# Patient Record
Sex: Female | Born: 1950 | Race: White | Hispanic: No | Marital: Married | Smoking: Never smoker
Health system: Southern US, Community
[De-identification: ages and names within clinical notes are randomized; demographics above are authoritative.]

## PROBLEM LIST (undated history)

## (undated) DIAGNOSIS — J449 Chronic obstructive pulmonary disease, unspecified: Secondary | ICD-10-CM

## (undated) DIAGNOSIS — J45909 Unspecified asthma, uncomplicated: Secondary | ICD-10-CM

## (undated) DIAGNOSIS — R569 Unspecified convulsions: Secondary | ICD-10-CM

## (undated) DIAGNOSIS — I509 Heart failure, unspecified: Secondary | ICD-10-CM

## (undated) HISTORY — PX: HERNIA REPAIR: SHX51

## (undated) HISTORY — PX: GALLBLADDER SURGERY: SHX652

---

## 2005-09-28 ENCOUNTER — Emergency Department: Payer: Self-pay | Admitting: Emergency Medicine

## 2007-08-06 ENCOUNTER — Emergency Department: Payer: Self-pay | Admitting: Emergency Medicine

## 2007-08-07 ENCOUNTER — Emergency Department: Payer: Self-pay | Admitting: Emergency Medicine

## 2008-04-16 ENCOUNTER — Ambulatory Visit: Payer: Self-pay | Admitting: Pain Medicine

## 2008-12-13 ENCOUNTER — Inpatient Hospital Stay: Payer: Self-pay | Admitting: Internal Medicine

## 2009-01-08 ENCOUNTER — Emergency Department: Payer: Self-pay | Admitting: Emergency Medicine

## 2009-01-16 ENCOUNTER — Emergency Department: Payer: Self-pay | Admitting: Emergency Medicine

## 2009-03-09 ENCOUNTER — Emergency Department: Payer: Self-pay | Admitting: Emergency Medicine

## 2009-03-12 ENCOUNTER — Emergency Department: Payer: Self-pay | Admitting: Emergency Medicine

## 2009-04-27 ENCOUNTER — Emergency Department: Payer: Self-pay | Admitting: Emergency Medicine

## 2009-06-15 ENCOUNTER — Emergency Department: Payer: Self-pay | Admitting: Emergency Medicine

## 2009-07-13 ENCOUNTER — Inpatient Hospital Stay: Payer: Self-pay | Admitting: Internal Medicine

## 2009-09-30 ENCOUNTER — Inpatient Hospital Stay: Payer: Self-pay | Admitting: Internal Medicine

## 2009-10-11 ENCOUNTER — Inpatient Hospital Stay: Payer: Self-pay | Admitting: Internal Medicine

## 2010-04-13 ENCOUNTER — Emergency Department: Payer: Self-pay | Admitting: Emergency Medicine

## 2010-05-24 ENCOUNTER — Emergency Department: Payer: Self-pay | Admitting: Emergency Medicine

## 2010-10-27 IMAGING — CT CT CERVICAL SPINE WITHOUT CONTRAST
2 series · 16 of 27 positions shown, 20 images · non-contrast
Comparison: none

REASON FOR EXAM: neck pain
COMMENTS:

PROCEDURE:     CT  - CT CERVICAL SPINE WO  - May 24, 2010  [DATE]
RESULT:     CT cervical spine dated 05/24/2010 comparison made to prior
study dated 10/11/2009.
TECHNIQUE: Multiplanar imaging cervical spine was obtained utilizing helical
2 mm acquisition and bone reconstruction algorithm.

[Series 5: sagittal · sagittal · 0.42mm/px · 5 of 55 slices shown, 6 images]
[im 19/55  bone]
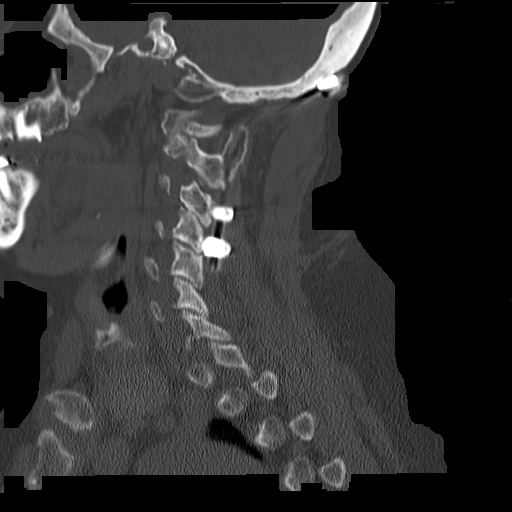
[im 23/55  bone]
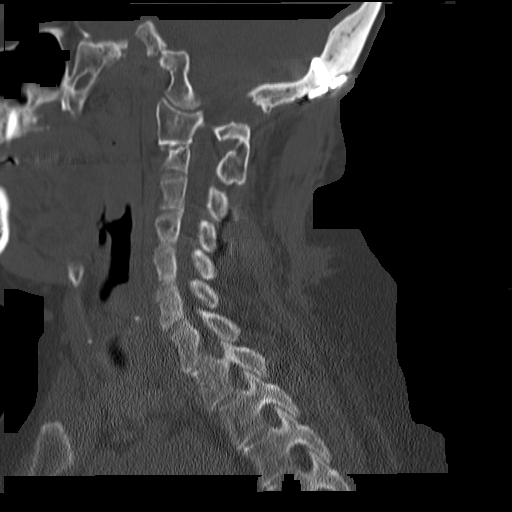
[im 28/55  soft-tissue]
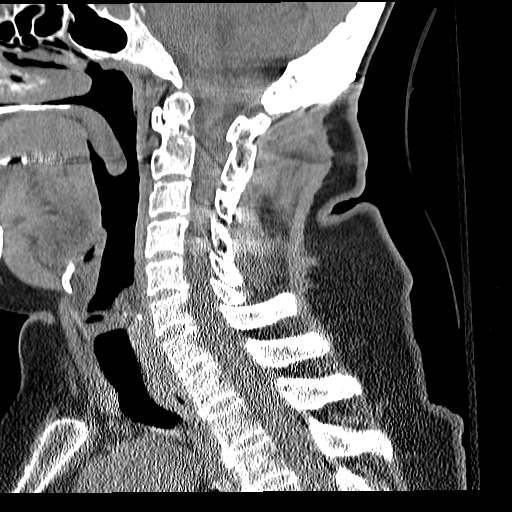
[im 28/55  bone]
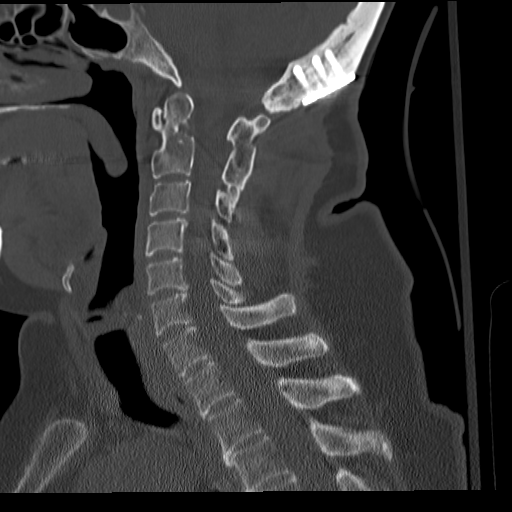
[im 32/55  bone]
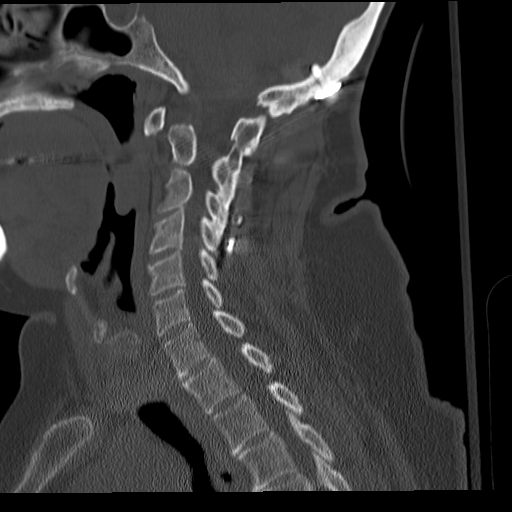
[im 37/55  bone]
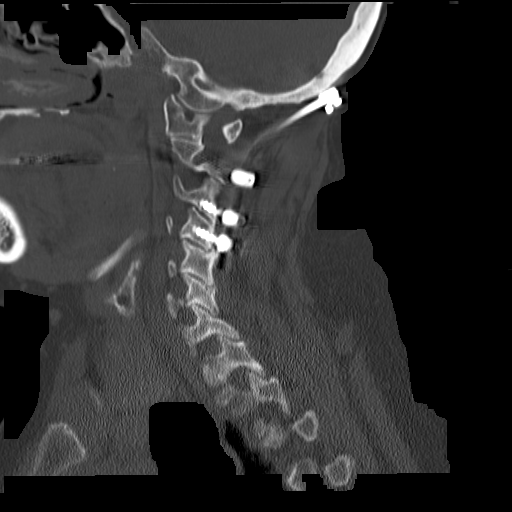

[Series 6: axial · axial · 0.34mm/px · z∈[-266,-110]mm · 11 of 94 slices shown, 14 images]
[im 8/94  soft-tissue]
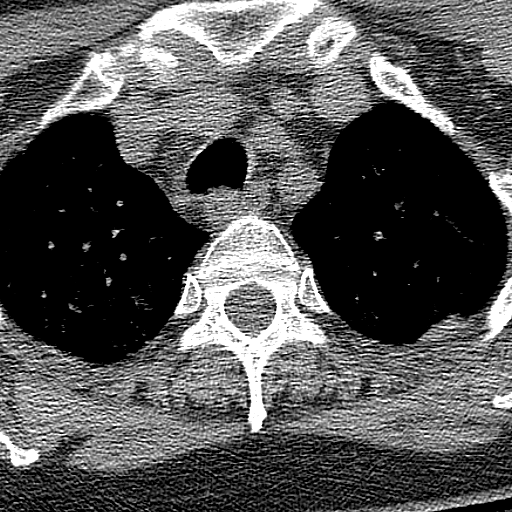
[im 8/94  bone]
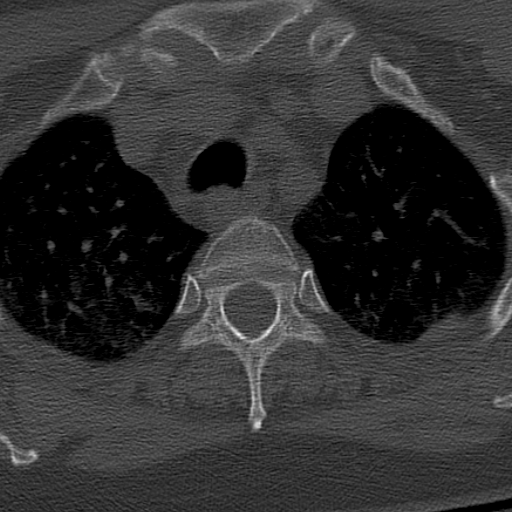
[im 15/94  bone]
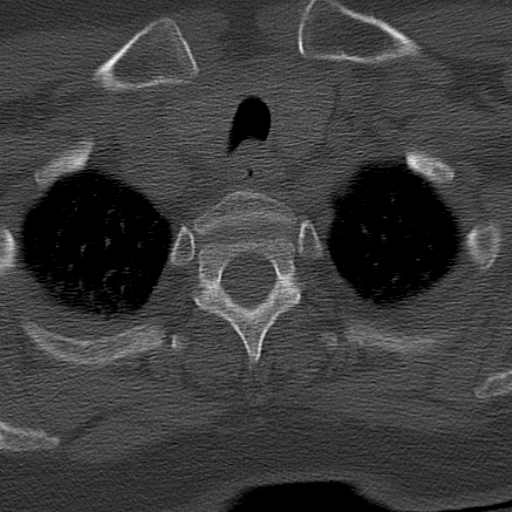
[im 22/94  bone]
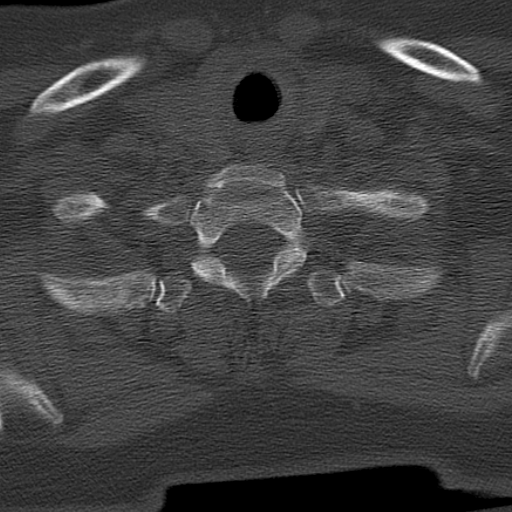
[im 29/94  bone]
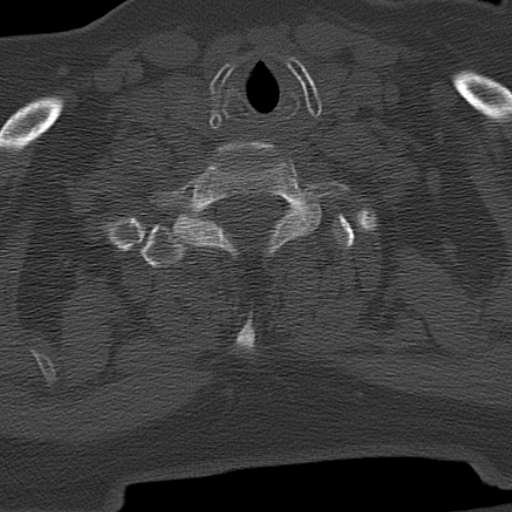
[im 36/94  soft-tissue]
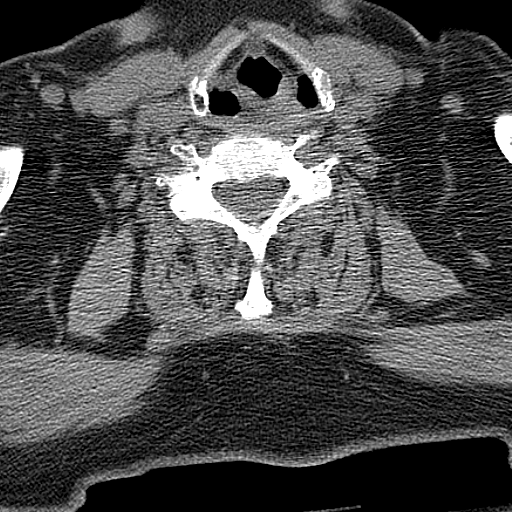
[im 36/94  bone]
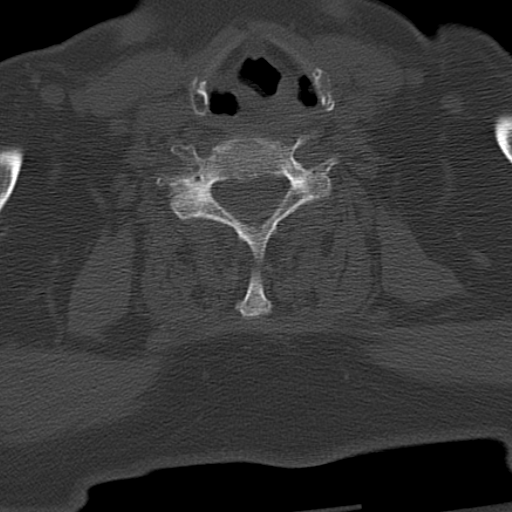
[im 51/94  bone]
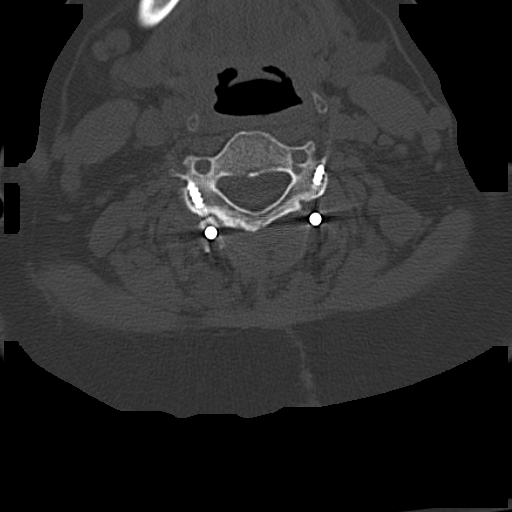
[im 58/94  bone]
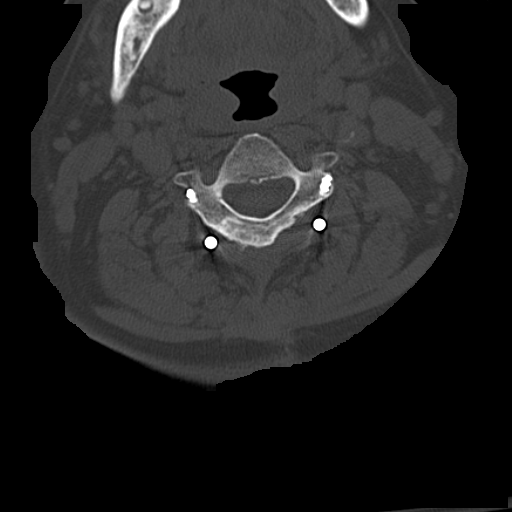
[im 65/94  bone]
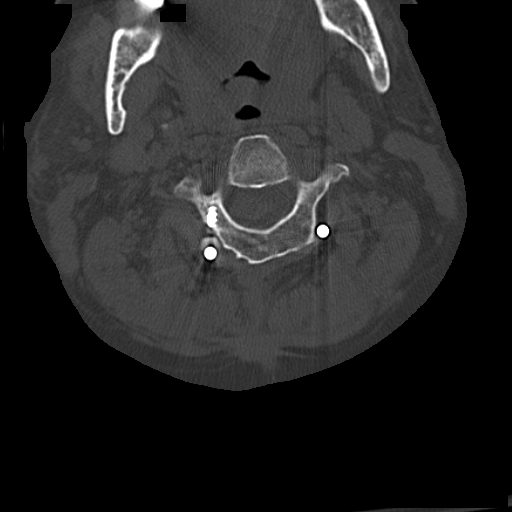
[im 72/94  soft-tissue]
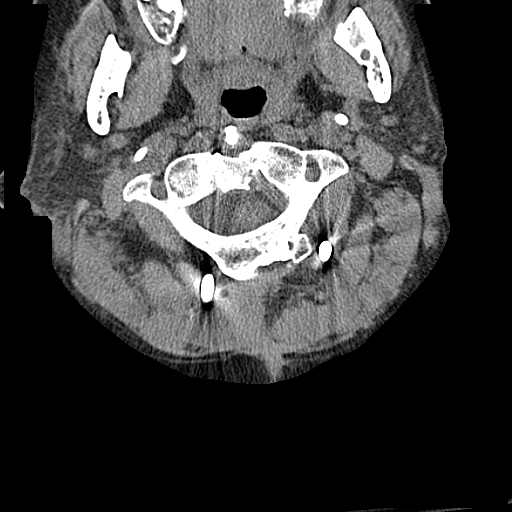
[im 72/94  bone]
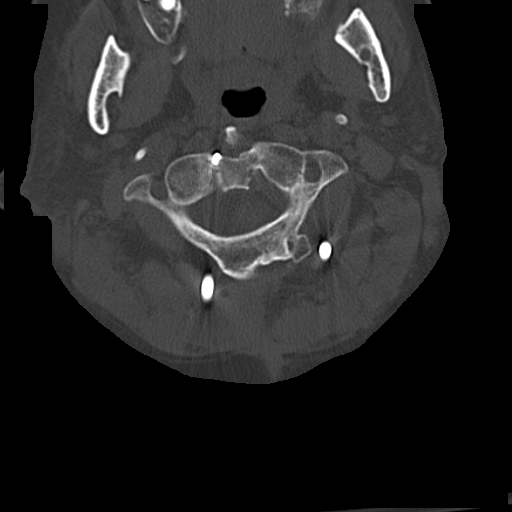
[im 79/94  bone]
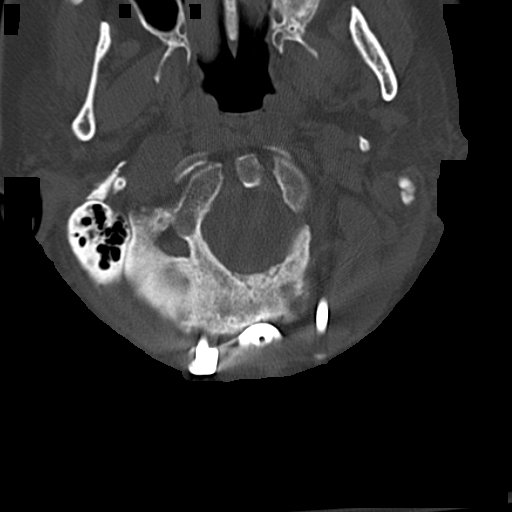
[im 86/94  bone]
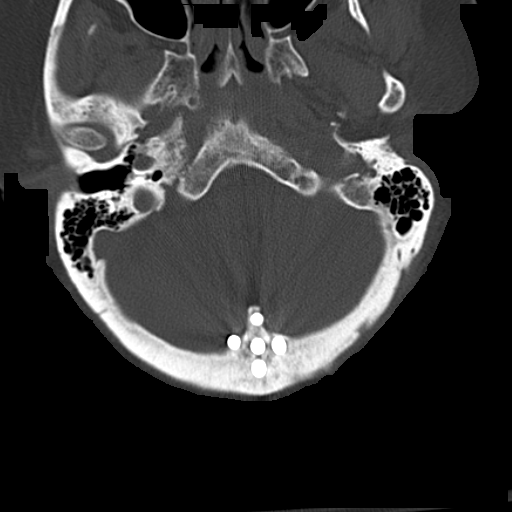

[16 of 27 positions shown; findings below may reference images not displayed]

FINDINGS: Postsurgical changes are once again identified within posterior
aspects of the cervical spine. Hardware appears intact. There is no evidence
of acute fracture the dislocation. The cervical canal is patent. There is no
evidence of prevertebral soft tissue swelling.
IMPRESSION: No CT evidence of acute osseous abnormalities.

## 2010-10-27 IMAGING — CR DG ABDOMEN 2V
1 series · 3 of 3 positions shown · non-contrast
Comparison: none

REASON FOR EXAM: pain
COMMENTS:

PROCEDURE:     DXR - DXR ABDOMEN 2 V FLAT AND ERECT  - May 24, 2010 [DATE]
RESULT:
Air is seen within nondilated loops of large and small bowel. A large amount
of stool is appreciated within the colon. The visualized bony skeleton
demonstrates S-shaped scoliosis within the thoracolumbar spine.

[Series 1: view not recorded · 0.17mm/px · 3 of 3 slices shown]
[im 1/3]
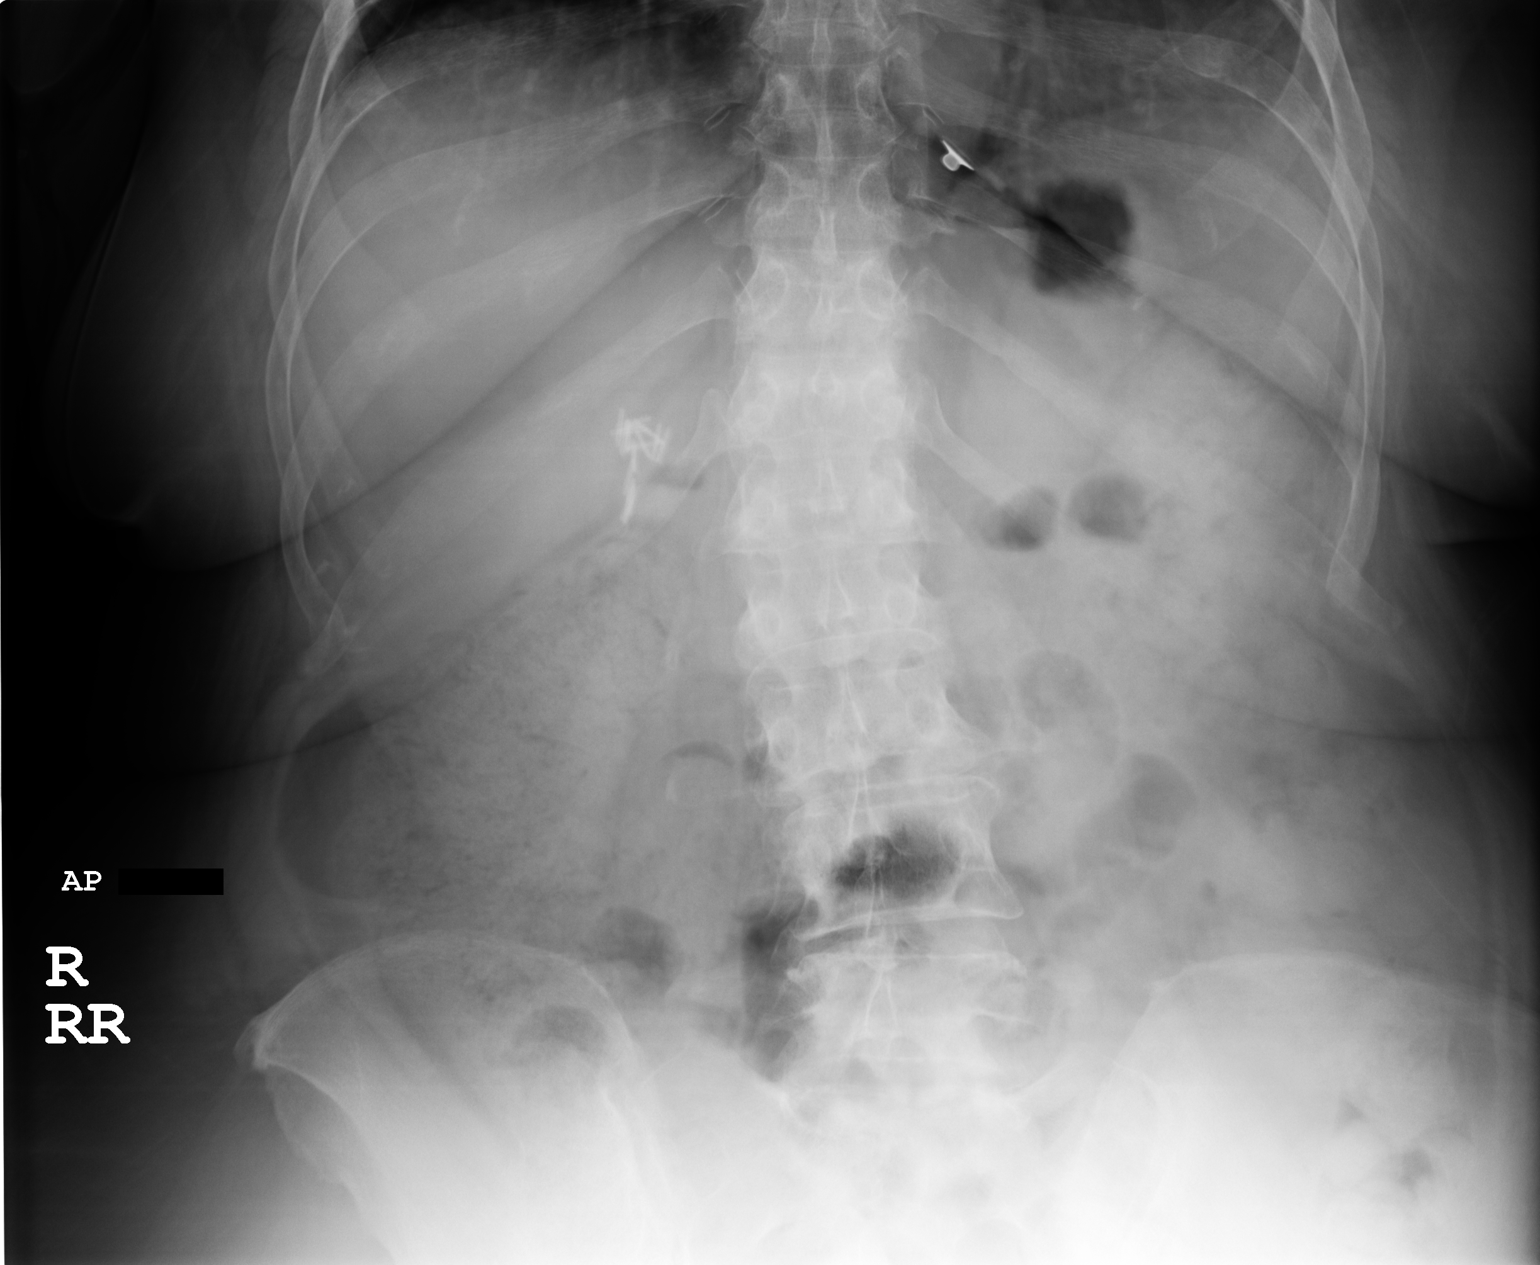
[im 2/3]
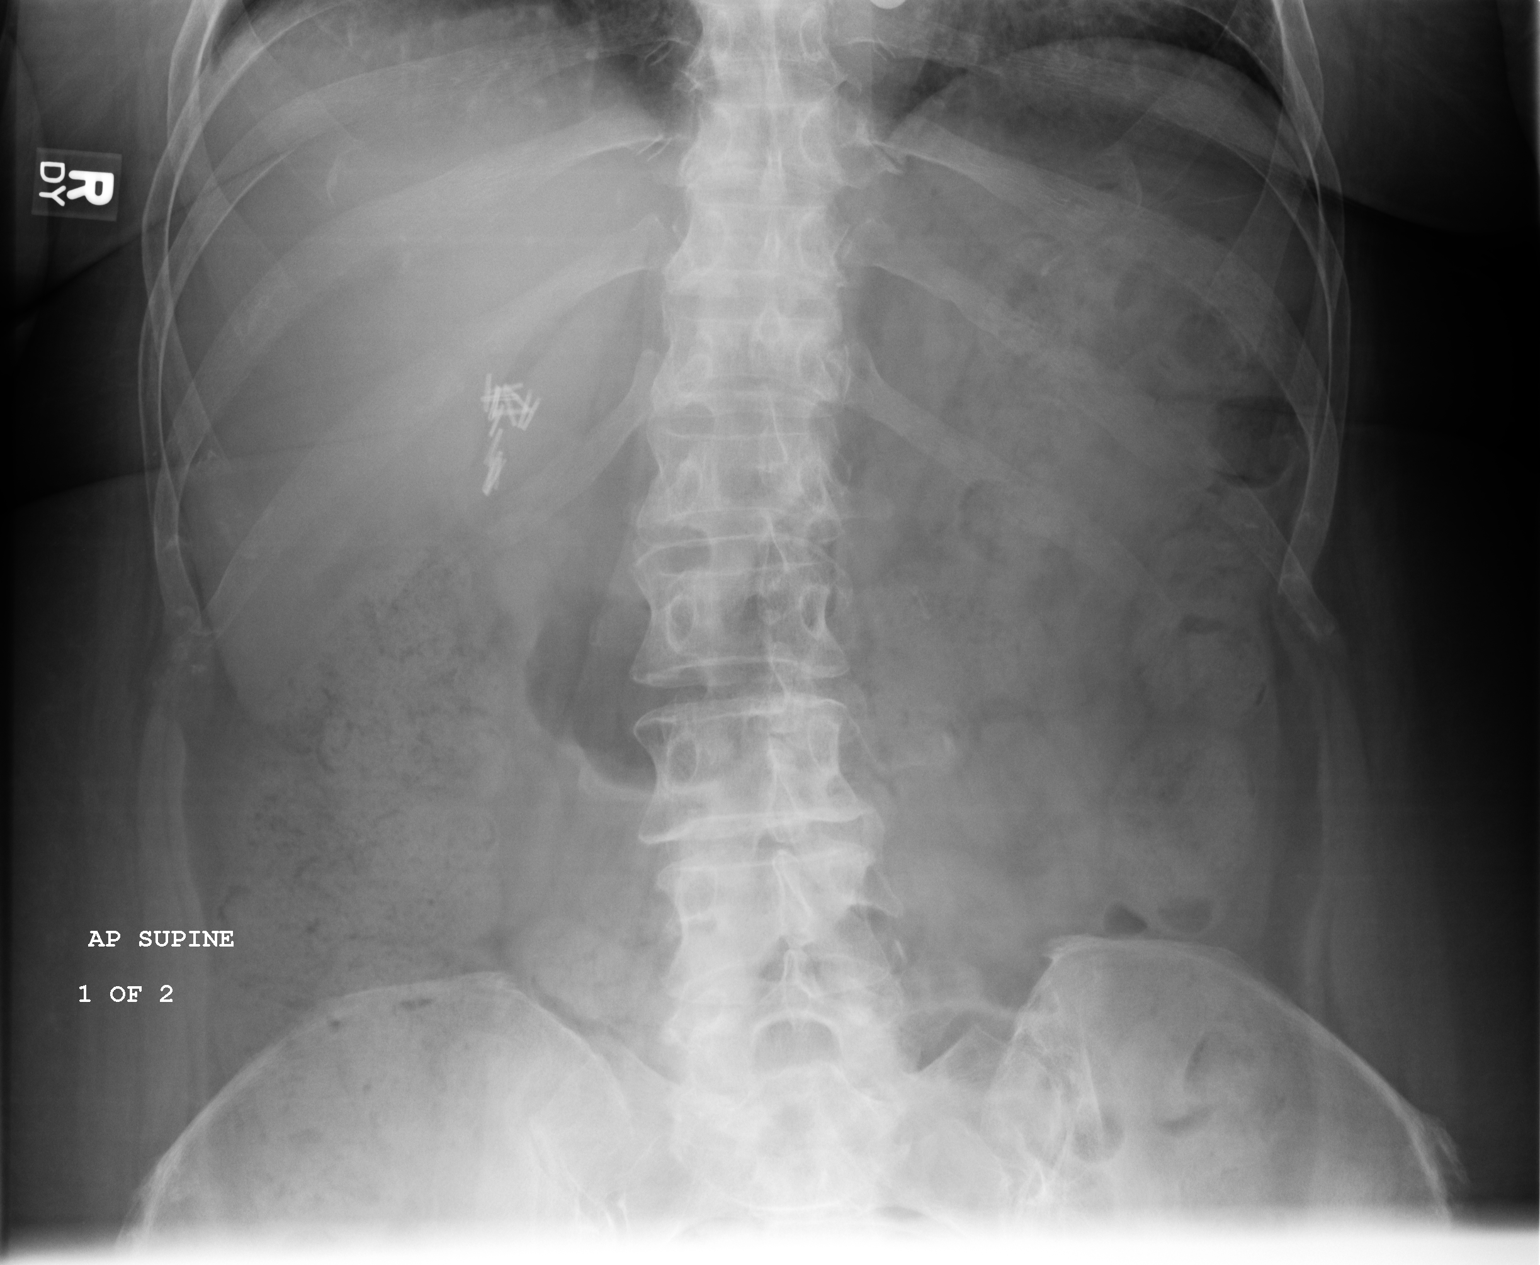
[im 3/3]
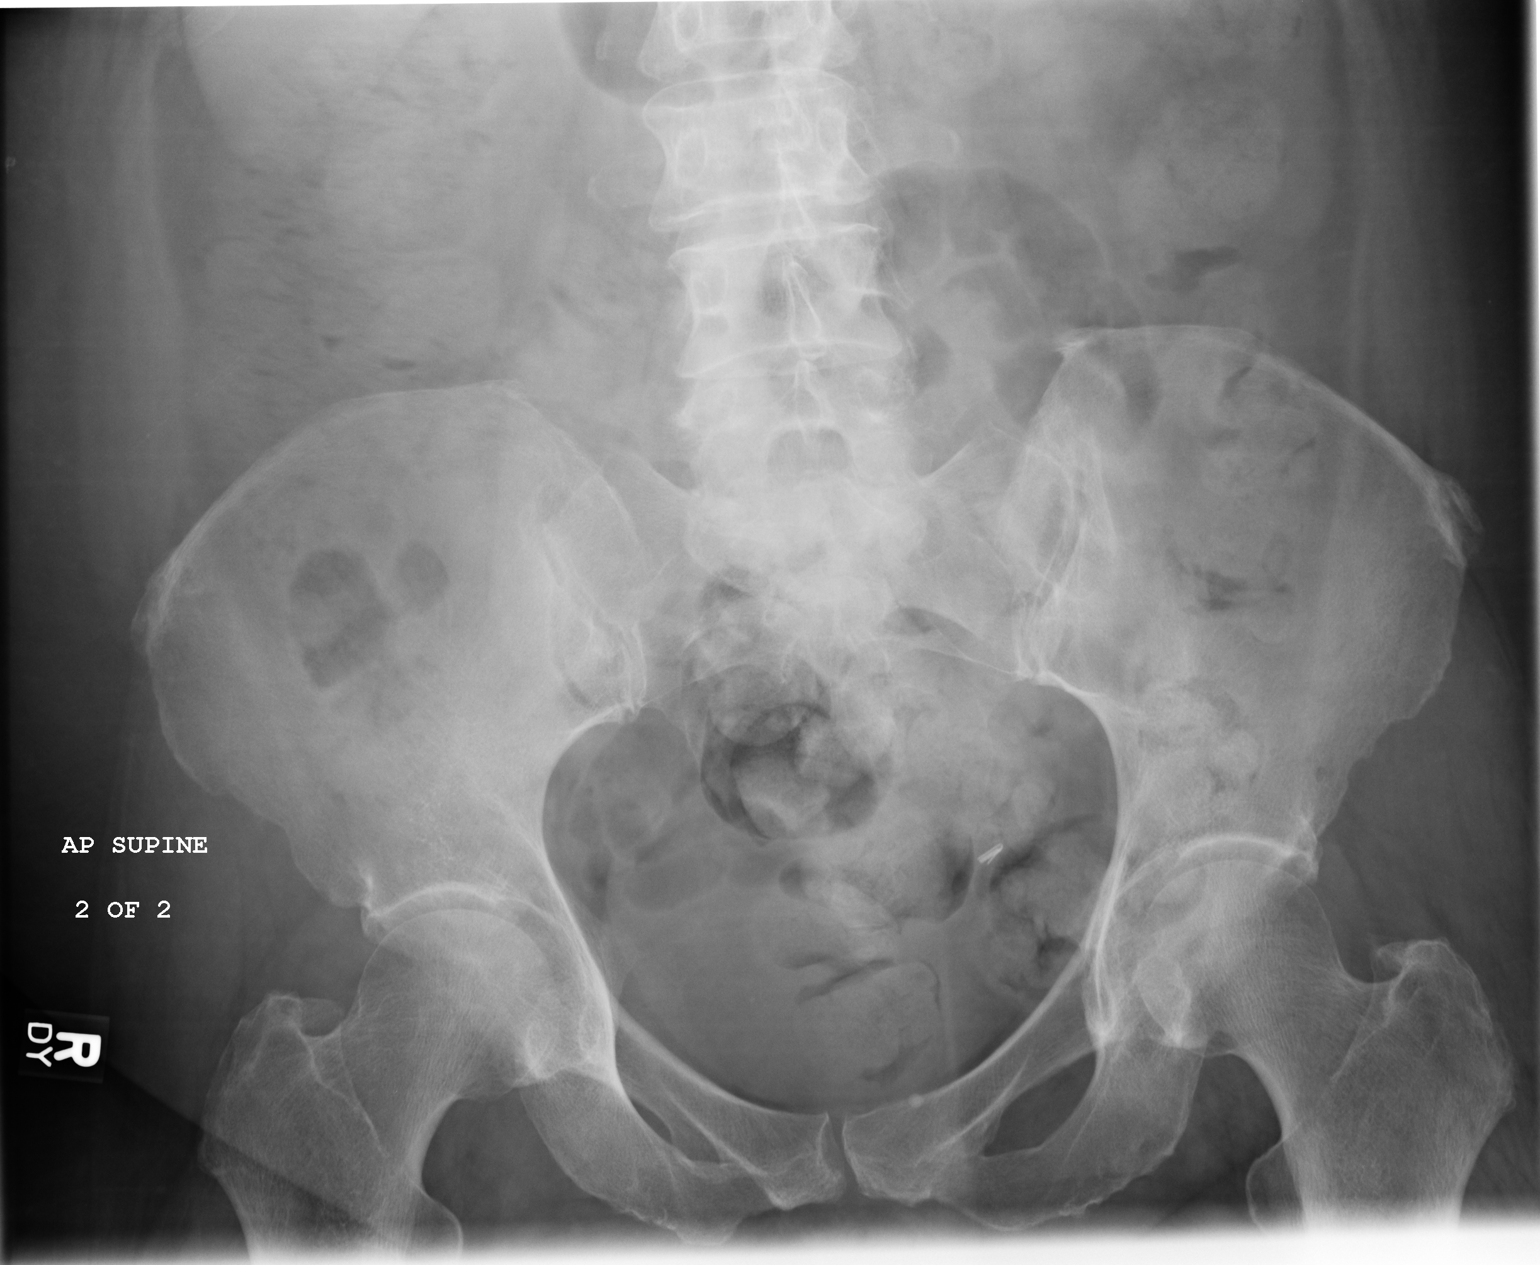

[3 of 3 positions shown; findings below may reference images not displayed]

IMPRESSION: Nonobstructive bowel gas pattern with a large amount of
stool.

## 2010-12-05 DIAGNOSIS — G894 Chronic pain syndrome: Secondary | ICD-10-CM | POA: Insufficient documentation

## 2012-11-05 DIAGNOSIS — I10 Essential (primary) hypertension: Secondary | ICD-10-CM | POA: Insufficient documentation

## 2015-07-18 ENCOUNTER — Emergency Department: Payer: Self-pay

## 2015-07-18 ENCOUNTER — Encounter: Payer: Self-pay | Admitting: Emergency Medicine

## 2015-07-18 ENCOUNTER — Emergency Department
Admission: EM | Admit: 2015-07-18 | Discharge: 2015-07-19 | Disposition: A | Discharge status: Home / Self Care | Payer: Self-pay | Attending: Emergency Medicine | Admitting: Emergency Medicine

## 2015-07-18 DIAGNOSIS — M545 Low back pain, unspecified: Secondary | ICD-10-CM

## 2015-07-18 DIAGNOSIS — S40011A Contusion of right shoulder, initial encounter: Secondary | ICD-10-CM | POA: Insufficient documentation

## 2015-07-18 DIAGNOSIS — Y92002 Bathroom of unspecified non-institutional (private) residence single-family (private) house as the place of occurrence of the external cause: Secondary | ICD-10-CM | POA: Insufficient documentation

## 2015-07-18 DIAGNOSIS — S299XXA Unspecified injury of thorax, initial encounter: Secondary | ICD-10-CM | POA: Insufficient documentation

## 2015-07-18 DIAGNOSIS — S3992XA Unspecified injury of lower back, initial encounter: Secondary | ICD-10-CM | POA: Insufficient documentation

## 2015-07-18 DIAGNOSIS — Y998 Other external cause status: Secondary | ICD-10-CM | POA: Insufficient documentation

## 2015-07-18 DIAGNOSIS — G8929 Other chronic pain: Secondary | ICD-10-CM | POA: Insufficient documentation

## 2015-07-18 DIAGNOSIS — Y9389 Activity, other specified: Secondary | ICD-10-CM | POA: Insufficient documentation

## 2015-07-18 DIAGNOSIS — W0110XA Fall on same level from slipping, tripping and stumbling with subsequent striking against unspecified object, initial encounter: Secondary | ICD-10-CM | POA: Insufficient documentation

## 2015-07-18 DIAGNOSIS — M546 Pain in thoracic spine: Secondary | ICD-10-CM

## 2015-07-18 HISTORY — DX: Heart failure, unspecified: I50.9

## 2015-07-18 HISTORY — DX: Unspecified convulsions: R56.9

## 2015-07-18 HISTORY — DX: Chronic obstructive pulmonary disease, unspecified: J44.9

## 2015-07-18 HISTORY — DX: Unspecified asthma, uncomplicated: J45.909

## 2015-07-18 NOTE — ED Notes (Signed)
Per EMS patient was at [employer] and fell in the bathroom. Patient fell on her bottom and denies hitting her head. Patient is wearing a neck collar that she states, "I have to wear this at all times when I am in motion." Patient c/o 10/10 spine and lower back pain. Patient is speaking in full sentences and is in no current distress at this time. A&O x4.

## 2015-07-18 NOTE — ED Notes (Signed)
Patient transported to X-ray 

## 2015-07-19 NOTE — ED Provider Notes (Signed)
[facility], [place] Emergency Department Provider Note   ____________________________________________  Time seen:  I have reviewed the triage vital signs and the triage nursing note.  HISTORY  Chief Complaint Fall   Historian Patient and husband  HPI Gabriela Miller is a 64 y.o. female with chronic back pain who is here after evaluation of fall onto her buttocks after her walker slipped away from her.  No head injury or neck pain. She states that she's having tenderness and pain from her buttock all the way up her spine. She also is complaining of some right-sided shoulder pain. No neurologic symptoms.  No chest pain or abdominal pain. She was up to her feet and walking without leg or pelvic pain.    Past Medical History  Diagnosis Date  . COPD (chronic obstructive pulmonary disease) (HCC)   . CHF (congestive heart failure) (HCC)   . Asthma   . Seizures (HCC)     There are no active problems to display for this patient.   Past Surgical History  Procedure Laterality Date  . Gallbladder surgery    . Hernia repair      No current outpatient prescriptions on file.  Allergies Benadryl  No family history on file.  Social History Social History  Substance Use Topics  . Smoking status: Never Smoker   . Smokeless tobacco: Not on file  . Alcohol Use: No    Review of Systems  Constitutional: Negative for fever. Eyes: Negative for visual changes. ENT: Negative for sore throat. Cardiovascular: Negative for chest pain. Respiratory: Negative for shortness of breath. Gastrointestinal: Negative for abdominal pain, vomiting and diarrhea. Genitourinary: Negative for dysuria. Musculoskeletal: Positive for back pain. Skin: Negative for rash. Neurological: Negative for headache. 10 point Review of Systems otherwise negative ____________________________________________   PHYSICAL EXAM:  VITAL SIGNS: ED Triage Vitals  Enc Vitals Group     BP 07/18/15 2133  124/50 mmHg     Pulse Rate 07/18/15 2133 91     Resp 07/18/15 2133 18     Temp 07/18/15 2133 98.4 F (36.9 C)     Temp src --      SpO2 07/18/15 2133 97 %     Weight 07/18/15 2133 246 lb 8 oz (111.812 kg)     Height 07/18/15 2133  (1.626 m)     Head Cir --      Peak Flow --      Pain Score 07/18/15 2135 10     Pain Loc --      Pain Edu? --      Excl. in GC? --      Constitutional: Alert and oriented. Well appearing and in no distress. Eyes: Conjunctivae are normal. PERRL. Normal extraocular movements. ENT   Head: Normocephalic and atraumatic.   Nose: No congestion/rhinnorhea.   Mouth/Throat: Mucous membranes are moist. Missing all upper teeth, chronic.   Neck: No stridor. Wearing a soft collar. No cervical spine tenderness. Cardiovascular/Chest: Normal rate, regular rhythm.  No murmurs, rubs, or gallops. Respiratory: Normal respiratory effort without tachypnea nor retractions. Breath sounds are clear and equal bilaterally. No wheezes/rales/rhonchi. Gastrointestinal: Soft. No distention, no guarding, no rebound. Nontender. Obese.  Genitourinary/rectal:Deferred Musculoskeletal: Moderate tenderness to palpation central and paraspinous thoracic and lumbar. Mild to moderate tenderness range of motion of the right shoulder and anteriorly. No deformity noted. Neurovascular intact in 4 extremities. Intact dressing of underlying hematoma right lower extremity. Neurologic:  Normal speech and language. No gross or focal neurologic deficits are  appreciated. Skin:  Skin is warm, dry and intact. No rash noted. Psychiatric: Mood and affect are normal. Speech and behavior are normal. Patient exhibits appropriate insight and judgment.  ____________________________________________   EKG I, Governor Rooksebecca Jayme Cham, MD, the attending physician have personally viewed and interpreted all ECGs.  91 bpm. Normal sinus rhythm. Right bundle branch block.. Normal axis. Nonspecific ST and  T-wave. ____________________________________________  LABS (pertinent positives/negatives)  None  ____________________________________________  RADIOLOGY All Xrays were viewed by me. Imaging interpreted by Radiologist.  Thoracic spine 2 views: No acute osseous abnormalities. Bony demineralization.  Lumbar spine: Mild degenerative changes in the lumbar spine. No acute displaced fractures at that site.  Right shoulder: No definite acute osseous abnormalities. __________________________________________  PROCEDURES  Procedure(s) performed: None  Critical Care performed: None  ____________________________________________   ED COURSE / ASSESSMENT AND PLAN  CONSULTATIONS: None  Pertinent labs & imaging results that were available during my care of the patient were reviewed by me and considered in my medical decision making (see chart for details).   Clinical suspicion for acute bony abnormality is somewhat low based on her description of the fall onto her buttock. After imaging of the areas that she is complaining of pain, there is no evidence of bony traumatic injury. I suspect her pain is due to contusion and strain.  Patient does take morphine and oxycodone at home. She was given an extra fiber ligament tablet of oxycodone here and it's been 4 hours since her last dose.  She is able to walk. We discussed she may have soft tissue pain over the next couple of days.   Patient / Family / Caregiver informed of clinical course, medical decision-making process, and agree with plan.   I discussed return precautions, follow-up instructions, and discharged instructions with patient and/or family.  ___________________________________________   FINAL CLINICAL IMPRESSION(S) / ED DIAGNOSES   Final diagnoses:  Midline thoracic back pain  Acute lumbar back pain  Contusion of right shoulder, initial encounter       Governor Rooksebecca Aditya Nastasi, MD 07/19/15 [phone]

## 2015-07-19 NOTE — Discharge Instructions (Signed)
You were evaluated after fall, and no serious injury is suspected. Your x-rays showed no fractures. I suspect your pain is coming from bruising and muscular strain. You may be sore for the next couple of days. Take it easy. Return to the emergency department for any worsening pain, weakness, numbness, or any other symptoms concerning to you.   Back Pain, Adult Back pain is very common in adults.The cause of back pain is rarely dangerous and the pain often gets better over time.The cause of your back pain may not be known. Some common causes of back pain include:  Strain of the muscles or ligaments supporting the spine.  Wear and tear (degeneration) of the spinal disks.  Arthritis.  Direct injury to the back. For many people, back pain may return. Since back pain is rarely dangerous, most people can learn to manage this condition on their own. HOME CARE INSTRUCTIONS Watch your back pain for any changes. The following actions may help to lessen any discomfort you are feeling:  Remain active. It is stressful on your back to sit or stand in one place for long periods of time. Do not sit, drive, or stand in one place for more than 30 minutes at a time. Take short walks on even surfaces as soon as you are able.Try to increase the length of time you walk each day.  Exercise regularly as directed by your health care provider. Exercise helps your back heal faster. It also helps avoid future injury by keeping your muscles strong and flexible.  Do not stay in bed.Resting more than 1-2 days can delay your recovery.  Pay attention to your body when you bend and lift. The most comfortable positions are those that put less stress on your recovering back. Always use proper lifting techniques, including:  Bending your knees.  Keeping the load close to your body.  Avoiding twisting.  Find a comfortable position to sleep. Use a firm mattress and lie on your side with your knees slightly bent. If you  lie on your back, put a pillow under your knees.  Avoid feeling anxious or stressed.Stress increases muscle tension and can worsen back pain.It is important to recognize when you are anxious or stressed and learn ways to manage it, such as with exercise.  Take medicines only as directed by your health care provider. Over-the-counter medicines to reduce pain and inflammation are often the most helpful.Your health care provider may prescribe muscle relaxant drugs.These medicines help dull your pain so you can more quickly return to your normal activities and healthy exercise.  Apply ice to the injured area:  Put ice in a plastic bag.  Place a towel between your skin and the bag.  Leave the ice on for 20 minutes, 2-3 times a day for the first 2-3 days. After that, ice and heat may be alternated to reduce pain and spasms.  Maintain a healthy weight. Excess weight puts extra stress on your back and makes it difficult to maintain good posture. SEEK MEDICAL CARE IF:  You have pain that is not relieved with rest or medicine.  You have increasing pain going down into the legs or buttocks.  You have pain that does not improve in one week.  You have night pain.  You lose weight.  You have a fever or chills. SEEK IMMEDIATE MEDICAL CARE IF:   You develop new bowel or bladder control problems.  You have unusual weakness or numbness in your arms or legs.  You develop  nausea or vomiting.  You develop abdominal pain.  You feel faint.   This information is not intended to replace advice given to you by your health care provider. Make sure you discuss any questions you have with your health care provider.   Document Released: 07/19/2005 Document Revised: 08/09/2014 Document Reviewed: 11/20/2013 Elsevier Interactive Patient Education [employer]2016 [employer]

## 2018-12-21 DIAGNOSIS — J449 Chronic obstructive pulmonary disease, unspecified: Secondary | ICD-10-CM | POA: Insufficient documentation

## 2019-05-13 DIAGNOSIS — S12600A Unspecified displaced fracture of seventh cervical vertebra, initial encounter for closed fracture: Secondary | ICD-10-CM | POA: Insufficient documentation

## 2021-07-10 ENCOUNTER — Inpatient Hospital Stay: Payer: Medicare Other

## 2021-07-10 ENCOUNTER — Observation Stay
Admission: EM | Admit: 2021-07-10 | Discharge: 2021-07-12 | Disposition: A | Discharge status: Home / Self Care | Payer: Medicare Other | Attending: Obstetrics and Gynecology | Admitting: Obstetrics and Gynecology

## 2021-07-10 ENCOUNTER — Other Ambulatory Visit: Payer: Self-pay

## 2021-07-10 DIAGNOSIS — R2689 Other abnormalities of gait and mobility: Secondary | ICD-10-CM | POA: Insufficient documentation

## 2021-07-10 DIAGNOSIS — R569 Unspecified convulsions: Secondary | ICD-10-CM

## 2021-07-10 DIAGNOSIS — Z20822 Contact with and (suspected) exposure to covid-19: Secondary | ICD-10-CM | POA: Diagnosis not present

## 2021-07-10 DIAGNOSIS — E039 Hypothyroidism, unspecified: Secondary | ICD-10-CM | POA: Diagnosis present

## 2021-07-10 DIAGNOSIS — I509 Heart failure, unspecified: Secondary | ICD-10-CM

## 2021-07-10 DIAGNOSIS — G40909 Epilepsy, unspecified, not intractable, without status epilepticus: Secondary | ICD-10-CM | POA: Insufficient documentation

## 2021-07-10 DIAGNOSIS — G894 Chronic pain syndrome: Secondary | ICD-10-CM | POA: Diagnosis not present

## 2021-07-10 DIAGNOSIS — Z6841 Body Mass Index (BMI) 40.0 and over, adult: Secondary | ICD-10-CM

## 2021-07-10 DIAGNOSIS — E871 Hypo-osmolality and hyponatremia: Principal | ICD-10-CM | POA: Diagnosis present

## 2021-07-10 DIAGNOSIS — Z79899 Other long term (current) drug therapy: Secondary | ICD-10-CM | POA: Insufficient documentation

## 2021-07-10 DIAGNOSIS — I10 Essential (primary) hypertension: Secondary | ICD-10-CM

## 2021-07-10 DIAGNOSIS — R531 Weakness: Secondary | ICD-10-CM

## 2021-07-10 LAB — CBC WITH DIFFERENTIAL/PLATELET
Abs Immature Granulocytes: 0.01 10*3/uL (ref 0.00–0.07)
Basophils Absolute: 0 10*3/uL (ref 0.0–0.1)
Basophils Relative: 0 %
Eosinophils Absolute: 0 10*3/uL (ref 0.0–0.5)
Eosinophils Relative: 0 %
HCT: 33.5 % — ABNORMAL LOW (ref 36.0–46.0)
Hemoglobin: 10.6 g/dL — ABNORMAL LOW (ref 12.0–15.0)
Immature Granulocytes: 0 %
Lymphocytes Relative: 10 %
Lymphs Abs: 0.5 10*3/uL — ABNORMAL LOW (ref 0.7–4.0)
MCH: 24.9 pg — ABNORMAL LOW (ref 26.0–34.0)
MCHC: 31.6 g/dL (ref 30.0–36.0)
MCV: 78.6 fL — ABNORMAL LOW (ref 80.0–100.0)
Monocytes Absolute: 0.3 10*3/uL (ref 0.1–1.0)
Monocytes Relative: 6 %
Neutro Abs: 4.2 10*3/uL (ref 1.7–7.7)
Neutrophils Relative %: 84 %
Platelets: 165 10*3/uL (ref 150–400)
RBC: 4.26 MIL/uL (ref 3.87–5.11)
RDW: 16.2 % — ABNORMAL HIGH (ref 11.5–15.5)
WBC: 5 10*3/uL (ref 4.0–10.5)
nRBC: 0 % (ref 0.0–0.2)

## 2021-07-10 LAB — URINALYSIS, ROUTINE W REFLEX MICROSCOPIC
Bilirubin Urine: NEGATIVE
Glucose, UA: NEGATIVE mg/dL
Ketones, ur: NEGATIVE mg/dL
Leukocytes,Ua: NEGATIVE
Nitrite: NEGATIVE
Protein, ur: NEGATIVE mg/dL
Specific Gravity, Urine: 1.01 (ref 1.005–1.030)
pH: 7.5 (ref 5.0–8.0)

## 2021-07-10 LAB — COMPREHENSIVE METABOLIC PANEL
ALT: 16 U/L (ref 0–44)
AST: 16 U/L (ref 15–41)
Albumin: 3.8 g/dL (ref 3.5–5.0)
Alkaline Phosphatase: 113 U/L (ref 38–126)
Anion gap: 7 (ref 5–15)
BUN: 5 mg/dL — ABNORMAL LOW (ref 8–23)
CO2: 22 mmol/L (ref 22–32)
Calcium: 8.5 mg/dL — ABNORMAL LOW (ref 8.9–10.3)
Chloride: 96 mmol/L — ABNORMAL LOW (ref 98–111)
Creatinine, Ser: 0.5 mg/dL (ref 0.44–1.00)
GFR, Estimated: >60 mL/min (ref >60)
Glucose, Bld: 103 mg/dL — ABNORMAL HIGH (ref 70–99)
Potassium: 4 mmol/L (ref 3.5–5.1)
Sodium: 125 mmol/L — ABNORMAL LOW (ref 135–145)
Total Bilirubin: 0.5 mg/dL (ref 0.3–1.2)
Total Protein: 7 g/dL (ref 6.5–8.1)

## 2021-07-10 LAB — HIV ANTIBODY (ROUTINE TESTING W REFLEX): HIV Screen 4th Generation wRfx: NONREACTIVE

## 2021-07-10 LAB — LACTIC ACID, PLASMA: Lactic Acid, Venous: 1.1 mmol/L (ref 0.5–1.9)

## 2021-07-10 LAB — URINALYSIS, MICROSCOPIC (REFLEX)
Bacteria, UA: NONE SEEN
Squamous Epithelial / HPF: NONE SEEN (ref 0–5)

## 2021-07-10 LAB — BRAIN NATRIURETIC PEPTIDE: B Natriuretic Peptide: 201.6 pg/mL — ABNORMAL HIGH (ref 0.0–100.0)

## 2021-07-10 LAB — RESP PANEL BY RT-PCR (FLU A&B, COVID) ARPGX2
Influenza A by PCR: NEGATIVE
Influenza B by PCR: NEGATIVE
SARS Coronavirus 2 by RT PCR: NEGATIVE

## 2021-07-10 MED ORDER — MORPHINE SULFATE ER 30 MG PO TBCR
30.0000 mg | EXTENDED_RELEASE_TABLET | Freq: Every day | ORAL | Status: DC
  Administered 2021-07-10 – 2021-07-11 (×2): 30 mg via ORAL
  Filled 2021-07-10 (×2): qty 1

## 2021-07-10 MED ORDER — LABETALOL HCL 5 MG/ML IV SOLN: 10.0000 mg | INTRAVENOUS | Status: DC | PRN

## 2021-07-10 MED ORDER — ENOXAPARIN SODIUM 40 MG/0.4ML IJ SOSY: 40.0000 mg | PREFILLED_SYRINGE | INTRAMUSCULAR | Status: DC

## 2021-07-10 MED ORDER — NORTRIPTYLINE HCL 25 MG PO CAPS
50.0000 mg | ORAL_CAPSULE | Freq: Every day | ORAL | Status: DC
  Administered 2021-07-11 (×2): 50 mg via ORAL
  Filled 2021-07-10 (×3): qty 2

## 2021-07-10 MED ORDER — DIAZEPAM 5 MG PO TABS
5.0000 mg | ORAL_TABLET | Freq: Two times a day (BID) | ORAL | Status: DC | PRN
  Administered 2021-07-10 – 2021-07-11 (×3): 5 mg via ORAL
  Filled 2021-07-10 (×3): qty 1

## 2021-07-10 MED ORDER — SODIUM CHLORIDE 0.9 % IV SOLN: INTRAVENOUS | Status: DC

## 2021-07-10 MED ORDER — ONDANSETRON HCL 4 MG PO TABS: 4.0000 mg | ORAL_TABLET | Freq: Four times a day (QID) | ORAL | Status: DC | PRN

## 2021-07-10 MED ORDER — LEVOTHYROXINE SODIUM 100 MCG PO TABS
100.0000 ug | ORAL_TABLET | Freq: Every day | ORAL | Status: DC
  Administered 2021-07-11 – 2021-07-12 (×2): 100 ug via ORAL
  Filled 2021-07-10 (×2): qty 1

## 2021-07-10 MED ORDER — FUROSEMIDE 10 MG/ML IJ SOLN
20.0000 mg | Freq: Once | INTRAMUSCULAR | Status: AC
  Administered 2021-07-10: 20 mg via INTRAVENOUS
  Filled 2021-07-10: qty 4

## 2021-07-10 MED ORDER — METHOCARBAMOL 500 MG PO TABS
1500.0000 mg | ORAL_TABLET | Freq: Four times a day (QID) | ORAL | Status: DC
  Administered 2021-07-10 – 2021-07-12 (×6): 1500 mg via ORAL
  Filled 2021-07-10: qty 3
  Filled 2021-07-10: qty 2
  Filled 2021-07-10 (×4): qty 3
  Filled 2021-07-10: qty 2
  Filled 2021-07-10: qty 3

## 2021-07-10 MED ORDER — ONDANSETRON HCL 4 MG/2ML IJ SOLN: 4.0000 mg | Freq: Four times a day (QID) | INTRAMUSCULAR | Status: DC | PRN

## 2021-07-10 MED ORDER — MECLIZINE HCL 25 MG PO TABS
25.0000 mg | ORAL_TABLET | Freq: Three times a day (TID) | ORAL | Status: DC | PRN
  Filled 2021-07-10: qty 1

## 2021-07-10 MED ORDER — ACETAMINOPHEN 650 MG RE SUPP
650.0000 mg | Freq: Four times a day (QID) | RECTAL | Status: DC | PRN
  Filled 2021-07-10: qty 1

## 2021-07-10 MED ORDER — LABETALOL HCL 5 MG/ML IV SOLN
20.0000 mg | Freq: Once | INTRAVENOUS | Status: AC
  Administered 2021-07-10: 20 mg via INTRAVENOUS
  Filled 2021-07-10: qty 4

## 2021-07-10 MED ORDER — LEVOTHYROXINE SODIUM 50 MCG PO TABS: 100.0000 ug | ORAL_TABLET | Freq: Every day | ORAL | Status: DC

## 2021-07-10 MED ORDER — MORPHINE SULFATE ER 15 MG PO TBCR
15.0000 mg | EXTENDED_RELEASE_TABLET | Freq: Two times a day (BID) | ORAL | Status: DC
  Filled 2021-07-10: qty 1

## 2021-07-10 MED ORDER — VERAPAMIL HCL 120 MG PO TABS
120.0000 mg | ORAL_TABLET | Freq: Three times a day (TID) | ORAL | Status: DC
  Administered 2021-07-11 – 2021-07-12 (×5): 120 mg via ORAL
  Filled 2021-07-10 (×8): qty 1

## 2021-07-10 MED ORDER — OXCARBAZEPINE 300 MG PO TABS
600.0000 mg | ORAL_TABLET | Freq: Two times a day (BID) | ORAL | Status: DC
  Administered 2021-07-10 – 2021-07-12 (×4): 600 mg via ORAL
  Filled 2021-07-10 (×5): qty 2

## 2021-07-10 MED ORDER — ATORVASTATIN CALCIUM 20 MG PO TABS
80.0000 mg | ORAL_TABLET | Freq: Every evening | ORAL | Status: DC
  Administered 2021-07-10 – 2021-07-11 (×2): 80 mg via ORAL
  Filled 2021-07-10 (×2): qty 4

## 2021-07-10 MED ORDER — SODIUM CHLORIDE 0.9 % IV BOLUS
500.0000 mL | Freq: Once | INTRAVENOUS | Status: AC
  Administered 2021-07-10: 500 mL via INTRAVENOUS

## 2021-07-10 MED ORDER — ACETAMINOPHEN 325 MG PO TABS
650.0000 mg | ORAL_TABLET | Freq: Four times a day (QID) | ORAL | Status: DC | PRN
  Administered 2021-07-10 – 2021-07-11 (×2): 650 mg via ORAL
  Filled 2021-07-10 (×2): qty 2

## 2021-07-10 MED ORDER — HYDRALAZINE HCL 20 MG/ML IJ SOLN: 10.0000 mg | Freq: Four times a day (QID) | INTRAMUSCULAR | Status: DC | PRN

## 2021-07-10 NOTE — ED Provider Notes (Signed)
[facility] Emergency Department Provider Note   ____________________________________________    I have reviewed the triage vital signs and the nursing notes.   HISTORY  Chief Complaint Incontinence, weakness, fatigue, body aches     HPI Gabriela Miller is a 70 y.o. female with history of asthma, CHF, COPD who presents with complaints of fatigue, weakness and body aches.  She reports had chills yesterday as well.  She has had several episodes of urinary and bowel incontinence over the last 2 days which is atypical for her.  She has chronic back pain which has not worsened.  No nausea vomiting or abdominal pain.  Past Medical History:  Diagnosis Date   Asthma    CHF (congestive heart failure) (HCC)    COPD (chronic obstructive pulmonary disease) (HCC)    Seizures (HCC)     Patient Active Problem List   Diagnosis Date Noted   Hyponatremia 07/10/2021   Weakness 07/10/2021   Seizures (HCC)    CHF (congestive heart failure) (HCC)    Morbid obesity with BMI of 45.0-49.9, adult (HCC)    Hypothyroidism     Past Surgical History:  Procedure Laterality Date   GALLBLADDER SURGERY     HERNIA REPAIR      Prior to Admission medications   Medication Sig Start Date End Date Taking? Authorizing Provider  atorvastatin (LIPITOR) 80 MG tablet Take 80 mg by mouth every evening. 05/15/21   [provider]  diazepam (VALIUM) 5 MG tablet Take 5 mg by mouth every 12 (twelve) hours as needed for anxiety. 06/11/21   [provider]  levothyroxine (SYNTHROID) 100 MCG tablet Take 100 mcg by mouth daily. 05/03/21   [provider]  meclizine (ANTIVERT) 25 MG tablet Take 25 mg by mouth 3 (three) times daily as needed for dizziness. 06/21/21   [provider]  methocarbamol (ROBAXIN) 500 MG tablet Take 1,500 mg by mouth 4 (four) times daily. 05/23/21   [provider]  morphine (MS CONTIN) 15 MG 12 hr tablet Take 15 mg by  mouth every 12 (twelve) hours. 06/26/21   [provider]  morphine (MS CONTIN) 30 MG 12 hr tablet Take 30 mg by mouth at bedtime. 06/26/21   [provider]  nortriptyline (PAMELOR) 25 MG capsule Take 50 mg by mouth at bedtime. 07/03/21   [provider]  oxcarbazepine (TRILEPTAL) 600 MG tablet Take 600 mg by mouth 2 (two) times daily. 05/12/21   [provider]  verapamil (CALAN) 120 MG tablet Take 120 mg by mouth 3 (three) times daily. 05/14/21   [provider]     Allergies Benadryl [diphenhydramine]  Family History  Problem Relation Age of Onset   Hypertension Mother     Social History Social History   Tobacco Use   Smoking status: Never  Substance Use Topics   Alcohol use: No   Drug use: No    Review of Systems  Constitutional: As above Eyes: No visual changes.  ENT: No sore throat. Cardiovascular: Denies chest pain. Respiratory: Denies shortness of breath. Gastrointestinal: No abdominal pain.  No nausea, no vomiting.   Genitourinary: Negative for dysuria. Musculoskeletal: As above Skin: Negative for rash. Neurological: Negative for headaches or weakness   ____________________________________________   PHYSICAL EXAM:  VITAL SIGNS: ED Triage Vitals  Enc Vitals Group     BP 07/10/21 1041 (!) 206/109     Pulse Rate 07/10/21 1041 85     Resp 07/10/21 1041 18  Temp 07/10/21 1044 98.2 F (36.8 C)     Temp Source 07/10/21 1041 Oral     SpO2 07/10/21 1041 97 %     Weight 07/10/21 1045 127 kg (280 lb)     Height 07/10/21 1045 1.626 m (5\' 4" )     Head Circumference --      Peak Flow --      Pain Score 07/10/21 1045 10     Pain Loc --      Pain Edu? --      Excl. in GC? --     Constitutional: Alert and oriented.   Nose: No congestion/rhinnorhea. Mouth/Throat: Mucous membranes are moist.   Neck:  Painless ROM Cardiovascular: Normal rate, regular rhythm. Grossly normal heart sounds.  Good peripheral  circulation. Respiratory: Normal respiratory effort.  No retractions. Lungs CTAB. Gastrointestinal: Soft and nontender. No distention.   Musculoskeletal: Normal strength in the lower extremities, no saddle anesthesia, warm and well perfused Neurologic:  Normal speech and language. No gross focal neurologic deficits are appreciated.  Skin:  Skin is warm, dry and intact. No rash noted. Psychiatric: Mood and affect are normal. Speech and behavior are normal.  ____________________________________________   LABS (all labs ordered are listed, but only abnormal results are displayed)  Labs Reviewed  COMPREHENSIVE METABOLIC PANEL - Abnormal; Notable for the following components:      Result Value   Sodium 125 (*)    Chloride 96 (*)    Glucose, Bld 103 (*)    BUN 5 (*)    Calcium 8.5 (*)    All other components within normal limits  CBC WITH DIFFERENTIAL/PLATELET - Abnormal; Notable for the following components:   Hemoglobin 10.6 (*)    HCT 33.5 (*)    MCV 78.6 (*)    MCH 24.9 (*)    RDW 16.2 (*)    Lymphs Abs 0.5 (*)    All other components within normal limits  URINALYSIS, ROUTINE W REFLEX MICROSCOPIC - Abnormal; Notable for the following components:   Hgb urine dipstick SMALL (*)    All other components within normal limits  RESP PANEL BY RT-PCR (FLU A&B, COVID) ARPGX2  LACTIC ACID, PLASMA  URINALYSIS, MICROSCOPIC (REFLEX)  HIV ANTIBODY (ROUTINE TESTING W REFLEX)   ____________________________________________  EKG   ____________________________________________  RADIOLOGY   ____________________________________________   PROCEDURES  Procedure(s) performed: No  Procedures   Critical Care performed: No ____________________________________________   INITIAL IMPRESSION / ASSESSMENT AND PLAN / ED COURSE  Pertinent labs & imaging results that were available during my care of the patient were reviewed by me and considered in my medical decision making (see chart for  details).   Patient presents with complaints of weakness as detailed above.  Afebrile here, vital signs demonstrate hypertension but otherwise reassuring.  Neurologically she seems to be intact, her back pain is not worsened and is at her baseline.  However her lab work is notable for sodium of 125 I suspect this is the cause of her weakness, and possibly of her incontinence as well.  Lactic acid, white blood cell count are normal.  Urinalysis is normal.  Discussed with the hospitalist for admission for further management and correction of her hyponatremia.    ____________________________________________   FINAL CLINICAL IMPRESSION(S) / ED DIAGNOSES  Final diagnoses:  Hypertension, unspecified type  Hyponatremia  Weakness        Note:  This document was prepared using Dragon voice recognition software and may include unintentional dictation errors.  Jene Every, MD 07/10/21 1501

## 2021-07-10 NOTE — ED Notes (Signed)
Attempted to draw BNP before transfer- unable to draw from IVC- will notify IP nurse and lab. Also checked for methocarbamol before transfer and not available from pharmacy yet.

## 2021-07-10 NOTE — ED Notes (Signed)
Informed RN bed assigned 

## 2021-07-10 NOTE — ED Notes (Signed)
Pt purewick changed ; pt HOB raised and legs lowered.

## 2021-07-10 NOTE — ED Notes (Signed)
Pt taken to MRI at this time

## 2021-07-10 NOTE — H&P (Signed)
History and Physical    [name] [id number] DOB: February 19, 1951 DOA: 07/10/2021  PCP: Hoover Brunette, MD   Patient coming from: Home  I have personally briefly reviewed patient's old medical records in [place]  Chief Complaint: Urinary and bowel incontinence  HPI: Gabriela Miller is a 70 y.o. female with medical history significant for chronic pain syndrome, history of prior back surgery, morbid obesity, seizure disorder, CHF and COPD who presents to the ER via EMS for evaluation of multiple complaints. She gives a 2-day history of urinary and fecal incontinence.  She has a history of chronic low back pain which she said is worse.  She has had a recent fall and says she rolled out of bed landing on her side.  She rates her pain a 5 x 10 in intensity at its worst. She also complains of an abscess on her labia and denies feeling well. She denies having any nausea, no vomiting, no changes in her bowel habits, no urinary frequency, no nocturia, no dysuria, no headache, no dizziness, no lightheadedness, no palpitations, no diaphoresis, no headache, no focal deficit or blurred vision. Sodium 125, potassium 4.0, chloride 96, bicarb 22, glucose 103, BUN 5, creatinine 0.50, calcium 8.5, alkaline phosphatase 113, albumin 3.8, AST 16, ALT 16, total protein 7.0, white count 5.0, hemoglobin 10.6, hematocrit 33.5, MCV 78.6, RDW 16.2, platelet count 165 Respiratory viral panel is negative Urine analysis is sterile Lumbar spine x-ray shows mild degenerative changes.  No acute displaced fractures. Right shoulder x-ray shows no definite acute osseous abnormalities. Right wrist x-ray shows no acute osseous abnormalities. Twelve-lead EKG reviewed by me shows sinus rhythm with LVH and a right bundle branch block.  ED Course: Patient is a 70 year old female who presents to the ER via EMS for evaluation of multiple symptoms but most concerning is urinary and fecal incontinence as well as worsening lower  back pain. Labs show hyponatremia. Blood pressure is also significantly elevated. She will be admitted to the hospital for further evaluation.  Review of Systems: As per HPI otherwise all other systems reviewed and negative.    Past Medical History:  Diagnosis Date   Asthma    CHF (congestive heart failure) (HCC)    COPD (chronic obstructive pulmonary disease) ([place])    Seizures ([place])     Past Surgical History:  Procedure Laterality Date   GALLBLADDER SURGERY     HERNIA REPAIR       reports that she has never smoked. She does not have any smokeless tobacco history on file. She reports that she does not drink alcohol and does not use drugs.  Allergies  Allergen Reactions   Benadryl [Diphenhydramine] Anaphylaxis    Family History  Problem Relation Age of Onset   Hypertension Mother       Prior to Admission medications   Medication Sig Start Date End Date Taking? Authorizing Provider  atorvastatin (LIPITOR) 80 MG tablet Take 80 mg by mouth every evening. 05/15/21   [provider]  diazepam (VALIUM) 5 MG tablet Take 5 mg by mouth every 12 (twelve) hours as needed for anxiety. 06/11/21   [provider]  levothyroxine (SYNTHROID) 100 MCG tablet Take 100 mcg by mouth daily. 05/03/21   [provider]  meclizine (ANTIVERT) 25 MG tablet Take 25 mg by mouth 3 (three) times daily as needed for dizziness. 06/21/21   [provider]  methocarbamol (ROBAXIN) 500 MG tablet Take 1,500 mg by mouth 4 (four) times daily. 05/23/21  [provider]  morphine (MS CONTIN) 15 MG 12 hr tablet Take 15 mg by mouth every 12 (twelve) hours. 06/26/21   [provider]  morphine (MS CONTIN) 30 MG 12 hr tablet Take 30 mg by mouth at bedtime. 06/26/21   [provider]  nortriptyline (PAMELOR) 25 MG capsule Take 50 mg by mouth at bedtime. 07/03/21   [provider]  oxcarbazepine (TRILEPTAL) 600 MG tablet Take 600 mg by mouth 2 (two)  times daily. 05/12/21   [provider]  verapamil (CALAN) 120 MG tablet Take 120 mg by mouth 3 (three) times daily. 05/14/21   [provider]    Physical Exam: Vitals:   07/10/21 1045 07/10/21 1145 07/10/21 1230 07/10/21 1300  BP:  (!) 224/107 (!) 207/116 (!) 184/97  Pulse:  85 99 70  Resp:  20  20  Temp:      TempSrc:      SpO2:  100% 100% 99%  Weight: 127 kg     Height: 5\' 4"  (1.626 m)        Vitals:   07/10/21 1045 07/10/21 1145 07/10/21 1230 07/10/21 1300  BP:  (!) 224/107 (!) 207/116 (!) 184/97  Pulse:  85 99 70  Resp:  20  20  Temp:      TempSrc:      SpO2:  100% 100% 99%  Weight: 127 kg     Height: 5\' 4"  (1.626 m)         Constitutional: Alert and oriented x 3 . Not in any apparent distress. Morbid Obesity HEENT:      Head: Normocephalic and atraumatic.         Eyes: PERLA, EOMI, Conjunctivae pallor. Sclera is non-icteric.       Mouth/Throat: Mucous membranes are moist.       Neck: Supple with no signs of meningismus. Cardiovascular: Regular rate and rhythm. No murmurs, gallops, or rubs. 2+ symmetrical distal pulses are present . No JVD. No LE edema Respiratory: Respiratory effort normal .Lungs sounds clear bilaterally. No wheezes, crackles, or rhonchi.  Gastrointestinal: Soft, non tender, and non distended with positive bowel sounds.  Central adiposity Genitourinary: No CVA tenderness. Musculoskeletal: Nontender with normal range of motion in all extremities. No cyanosis, or erythema of extremities. Neurologic:  Face is symmetric. Moving all extremities. No gross focal neurologic deficits .  Generalized weakness Skin: Skin is warm, dry.  No rash or ulcers Psychiatric: Mood and affect are normal    Labs on Admission: I have personally reviewed following labs and imaging studies  CBC: Recent Labs  Lab 07/10/21 1108  WBC 5.0  NEUTROABS 4.2  HGB 10.6*  HCT 33.5*  MCV 78.6*  PLT 165   Basic Metabolic Panel: Recent Labs  Lab  07/10/21 1108  NA 125*  K 4.0  CL 96*  CO2 22  GLUCOSE 103*  BUN 5*  CREATININE 0.50  CALCIUM 8.5*   GFR: Estimated Creatinine Clearance: 87.6 mL/min (by C-G formula based on SCr of 0.5 mg/dL). Liver Function Tests: Recent Labs  Lab 07/10/21 1108  AST 16  ALT 16  ALKPHOS 113  BILITOT 0.5  PROT 7.0  ALBUMIN 3.8   No results for input(s): LIPASE, AMYLASE in the last 168 hours. No results for input(s): AMMONIA in the last 168 hours. Coagulation Profile: No results for input(s): INR, PROTIME in the last 168 hours. Cardiac Enzymes: No results for input(s): CKTOTAL, CKMB, CKMBINDEX, TROPONINI in the last 168 hours. BNP (last 3 results)  No results for input(s): PROBNP in the last 8760 hours. HbA1C: No results for input(s): HGBA1C in the last 72 hours. CBG: No results for input(s): GLUCAP in the last 168 hours. Lipid Profile: No results for input(s): CHOL, HDL, LDLCALC, TRIG, CHOLHDL, LDLDIRECT in the last 72 hours. Thyroid Function Tests: No results for input(s): TSH, T4TOTAL, FREET4, T3FREE, THYROIDAB in the last 72 hours. Anemia Panel: No results for input(s): VITAMINB12, FOLATE, FERRITIN, TIBC, IRON, RETICCTPCT in the last 72 hours. Urine analysis:    Component Value Date/Time   COLORURINE YELLOW 07/10/2021 1238   APPEARANCEUR CLEAR 07/10/2021 1238   LABSPEC 1.010 07/10/2021 1238   PHURINE 7.5 07/10/2021 1238   GLUCOSEU NEGATIVE 07/10/2021 1238   HGBUR SMALL (A) 07/10/2021 1238   BILIRUBINUR NEGATIVE 07/10/2021 1238   KETONESUR NEGATIVE 07/10/2021 1238   PROTEINUR NEGATIVE 07/10/2021 1238   NITRITE NEGATIVE 07/10/2021 1238   LEUKOCYTESUR NEGATIVE 07/10/2021 1238    Radiological Exams on Admission: No results found.   Assessment/Plan Principal Problem:   Hyponatremia Active Problems:   Weakness   Seizures (HCC)   CHF (congestive heart failure) (HCC)   Morbid obesity with BMI of 45.0-49.9, adult [facility])   Hypothyroidism     Patient is a 70 year old  female who presents to the ER for evaluation of worsening low back pain as well as urinary and fecal incontinence.  She also has hyponatremia    Hyponatremia Unclear etiology may be secondary to poor oral intake Will obtain urine and serum osmolality as well as random urine sodium Gentle IV fluid hydration Repeat sodium levels in a.m.      Worsening low back pain With associated urinary or fecal incontinence Obtain MRI of the thoracic and lumbar spine Continue pain control     Morbid obesity (BMI 48) Complicates overall prognosis and care    Hypothyroidism Continue Synthroid    Hypertension Uncontrolled Patient received a dose of IV labetalol in the ER Continue verapamil    Chronic pain syndrome Continue MS Contin     DVT prophylaxis: Lovenox  Code Status: full code  Family Communication: Greater than 50% of time was spent discussing plan of care with patient at the bedside.  All questions and concerns have been addressed.  She verbalizes understanding and agrees with the plan. Disposition Plan: Back to previous home environment Consults called: none  Status:At the time of admission, it appears that the appropriate admission status for this patient is inpatient. This is judged to be reasonable and necessary to provide the required intensity of service to ensure the patient's safety given the presenting symptoms, physical exam findings, and initial radiographic and laboratory data in the context of their comorbid conditions. Patient requires inpatient status due to high intensity of service, high risk for further deterioration and high frequency of surveillance required.     Lucile Shutters MD [facility]     07/10/2021, 3:01 PM

## 2021-07-10 NOTE — ED Triage Notes (Addendum)
Pt comes into the ED via EMS from home with c/o urinary and bowl incontinence  for the past 2 days, abscess on labia, BL LE with redness, HA, just not feeling well. Pt has a hx of cervical and lumbar surgery, denies any recent injury,    190/32 85HR 99%RA 100.6 temp with fire 98.6 temp

## 2021-07-11 DIAGNOSIS — E871 Hypo-osmolality and hyponatremia: Secondary | ICD-10-CM | POA: Diagnosis not present

## 2021-07-11 LAB — IRON AND TIBC
Iron: 42 ug/dL (ref 28–170)
Saturation Ratios: 10 % — ABNORMAL LOW (ref 10.4–31.8)
TIBC: 426 ug/dL (ref 250–450)
UIBC: 384 ug/dL

## 2021-07-11 LAB — TSH: TSH: 3.223 u[IU]/mL (ref 0.350–4.500)

## 2021-07-11 LAB — BASIC METABOLIC PANEL
Anion gap: 9 (ref 5–15)
BUN: 12 mg/dL (ref 8–23)
CO2: 26 mmol/L (ref 22–32)
Calcium: 8.3 mg/dL — ABNORMAL LOW (ref 8.9–10.3)
Chloride: 98 mmol/L (ref 98–111)
Creatinine, Ser: 0.86 mg/dL (ref 0.44–1.00)
GFR, Estimated: >60 mL/min (ref >60)
Glucose, Bld: 83 mg/dL (ref 70–99)
Potassium: 3.1 mmol/L — ABNORMAL LOW (ref 3.5–5.1)
Sodium: 133 mmol/L — ABNORMAL LOW (ref 135–145)

## 2021-07-11 LAB — FERRITIN: Ferritin: 13 ng/mL (ref 11–307)

## 2021-07-11 LAB — SODIUM, URINE, RANDOM: Sodium, Ur: 78 mmol/L

## 2021-07-11 LAB — OSMOLALITY: Osmolality: 280 mOsm/kg (ref 275–295)

## 2021-07-11 LAB — MAGNESIUM: Magnesium: 2.3 mg/dL (ref 1.7–2.4)

## 2021-07-11 LAB — OSMOLALITY, URINE: Osmolality, Ur: 385 mOsm/kg (ref 300–900)

## 2021-07-11 MED ORDER — POTASSIUM CHLORIDE CRYS ER 20 MEQ PO TBCR
40.0000 meq | EXTENDED_RELEASE_TABLET | ORAL | Status: AC
  Administered 2021-07-11 (×2): 40 meq via ORAL

## 2021-07-11 MED ORDER — POTASSIUM CHLORIDE CRYS ER 20 MEQ PO TBCR
80.0000 meq | EXTENDED_RELEASE_TABLET | Freq: Once | ORAL | Status: DC
  Filled 2021-07-11: qty 4

## 2021-07-11 MED ORDER — MORPHINE SULFATE ER 15 MG PO TBCR
15.0000 mg | EXTENDED_RELEASE_TABLET | Freq: Two times a day (BID) | ORAL | Status: DC
  Administered 2021-07-11 – 2021-07-12 (×3): 15 mg via ORAL
  Filled 2021-07-11 (×3): qty 1

## 2021-07-11 MED ORDER — SULFAMETHOXAZOLE-TRIMETHOPRIM 800-160 MG PO TABS
1.0000 | ORAL_TABLET | Freq: Two times a day (BID) | ORAL | Status: DC
  Administered 2021-07-11 – 2021-07-12 (×3): 1 via ORAL
  Filled 2021-07-11 (×4): qty 1

## 2021-07-11 NOTE — Progress Notes (Addendum)
PROGRESS NOTE    Gabriela Miller  WUX:[id number] DOB: [birth year] DOA: 07/10/2021 PCP: Lance Bosch, MD  Outpatient Specialists: neurology    Brief Narrative:   From admission hpi Gabriela Miller is a 70 y.o. female with medical history significant for chronic pain syndrome, history of prior back surgery, morbid obesity, seizure disorder, CHF and COPD who presents to the ER via EMS for evaluation of multiple complaints. She gives a 2-day history of urinary and fecal incontinence.  She has a history of chronic low back pain which she said is worse.  She has had a recent fall and says she rolled out of bed landing on her side.  She rates her pain a 5 x 10 in intensity at its worst. She also complains of an abscess on her labia and denies feeling well. She denies having any nausea, no vomiting, no changes in her bowel habits, no urinary frequency, no nocturia, no dysuria, no headache, no dizziness, no lightheadedness, no palpitations, no diaphoresis, no headache, no focal deficit or blurred vision.   Assessment & Plan:   Principal Problem:   Hyponatremia Active Problems:   Weakness   Seizures (HCC)   CHF (congestive heart failure) (HCC)   Morbid obesity with BMI of 45.0-49.9, adult (HCC)   Hypothyroidism  # Hyponatremia Suspect siadh 2/2 meds. Here was initially given fluids then given lasix. Appears to me to be euvolemic. Oxcarbazepine most likely culprit; morphine and nortriptyline likely also contribute. Has baseline hyponatremia in low 130s; here 125 on admission - f/u tsh - f/u urine studies - fluid restrict 1.5 L - consider neuro input re: med changes  # Fecal and urinary incontinence MRI of lumbar and thoracic spine w/o acute abnormalities, no signs cauda equina, no lower extremity neurologic symptoms. No diarrhea. Urinalysis not suggestive of infection - will check bladder scan - monitor for now, outpt GI f/u if persists  # Debility Uses walker at home - pt consult  #  Seizure disorder Cont trileptal for now  # Labial abscess Left upper labia majora - gen surg to see - start bactrim  # Chronic pain - home ms contin  # Chronic migraine - home verapamil, nortriptyline, meclizine, robaxin  # HTN - home statin, verapamil  # GAD - home benzo  # Microcytic anemia Mild, no report of bleeding - f/u ferritin, iron panel  DVT prophylaxis: SCDs (refuses pharmacologic ppx) Code Status: full Family Communication: none @ bedside  Level of care: Telemetry Medical Status is: Inpatient  Remains inpatient appropriate because: need for ongoing inpatient w/u        Consultants:  Gen surg  Procedures: none  Antimicrobials:  none    Subjective: Had one small volume episode fecal incontinence when passing gas this morning. Chronic back pain unchanged. No fever, no nausea/vomiting  Objective: Vitals:   07/10/21 2019 07/10/21 2035 07/11/21 0315 07/11/21 0856  BP: (!) 167/95 (!) 162/87 132/75 (!) 153/74  Pulse: 70 68 74 78  Resp: Temp: 98.1 F (36.7 C)  98.7 F (37.1 C) 98.7 F (37.1 C)  TempSrc: Oral  Oral Oral  SpO2: 100%  97% 95%  Weight:      Height:        Intake/Output Summary (Last 24 hours) at 07/11/2021 0912 Last data filed at 07/11/2021 0532 Gross per 24 hour  Intake --  Output 2500 ml  Net -2500 ml   Filed Weights   07/10/21 1045  Weight: 127 kg  Examination:  General exam: Appears calm and comfortable  Respiratory system: Clear to auscultation. Respiratory effort normal. Cardiovascular system: S1 & S2 heard, RRR. No JVD, murmurs, rubs, gallops or clicks.  Gastrointestinal system: Abdomen is obese, soft and nontender. No organomegaly or masses felt. Normal bowel sounds heard. Central nervous system: Alert and oriented. No focal neurological deficits. Extremities: Symmetric 5 x 5 power. Trace pedal edema Skin: 2 cm abscess left labia majora superior, small area of induration inferior right labia  majora Psychiatry: Judgement and insight appear normal. Mood & affect appropriate.     Data Reviewed: I have personally reviewed following labs and imaging studies  CBC: Recent Labs  Lab 07/10/21 1108  WBC 5.0  NEUTROABS 4.2  HGB 10.6*  HCT 33.5*  MCV 78.6*  PLT 165   Basic Metabolic Panel: Recent Labs  Lab 07/10/21 1108  NA 125*  K 4.0  CL 96*  CO2 22  GLUCOSE 103*  BUN 5*  CREATININE 0.50  CALCIUM 8.5*   GFR: Estimated Creatinine Clearance: 87.6 mL/min (by C-G formula based on SCr of 0.5 mg/dL). Liver Function Tests: Recent Labs  Lab 07/10/21 1108  AST 16  ALT 16  ALKPHOS 113  BILITOT 0.5  PROT 7.0  ALBUMIN 3.8   No results for input(s): LIPASE, AMYLASE in the last 168 hours. No results for input(s): AMMONIA in the last 168 hours. Coagulation Profile: No results for input(s): INR, PROTIME in the last 168 hours. Cardiac Enzymes: No results for input(s): CKTOTAL, CKMB, CKMBINDEX, TROPONINI in the last 168 hours. BNP (last 3 results) No results for input(s): PROBNP in the last 8760 hours. HbA1C: No results for input(s): HGBA1C in the last 72 hours. CBG: No results for input(s): GLUCAP in the last 168 hours. Lipid Profile: No results for input(s): CHOL, HDL, LDLCALC, TRIG, CHOLHDL, LDLDIRECT in the last 72 hours. Thyroid Function Tests: No results for input(s): TSH, T4TOTAL, FREET4, T3FREE, THYROIDAB in the last 72 hours. Anemia Panel: No results for input(s): VITAMINB12, FOLATE, FERRITIN, TIBC, IRON, RETICCTPCT in the last 72 hours. Urine analysis:    Component Value Date/Time   COLORURINE YELLOW 07/10/2021 1238   APPEARANCEUR CLEAR 07/10/2021 1238   LABSPEC 1.010 07/10/2021 1238   PHURINE 7.5 07/10/2021 1238   GLUCOSEU NEGATIVE 07/10/2021 1238   HGBUR SMALL (A) 07/10/2021 1238   BILIRUBINUR NEGATIVE 07/10/2021 1238   KETONESUR NEGATIVE 07/10/2021 1238   PROTEINUR NEGATIVE 07/10/2021 1238   NITRITE NEGATIVE 07/10/2021 1238   LEUKOCYTESUR  NEGATIVE 07/10/2021 1238   Sepsis Labs: @LABRCNTIP (procalcitonin:4,lacticidven:4)  ) Recent Results (from the past 240 hour(s))  Resp Panel by RT-PCR (Flu A&B, Covid) Nasopharyngeal Swab     Status: None   Collection Time: 07/10/21 12:38 PM   Specimen: Nasopharyngeal Swab; Nasopharyngeal(NP) swabs in vial transport medium  Result Value Ref Range Status   SARS Coronavirus 2 by RT PCR NEGATIVE NEGATIVE Final    Comment: (NOTE) SARS-CoV-2 target nucleic acids are NOT DETECTED.  The SARS-CoV-2 RNA is generally detectable in upper respiratory specimens during the acute phase of infection. The lowest concentration of SARS-CoV-2 viral copies this assay can detect is 138 copies/mL. A negative result does not preclude SARS-Cov-2 infection and should not be used as the sole basis for treatment or other patient management decisions. A negative result may occur with  improper specimen collection/handling, submission of specimen other than nasopharyngeal swab, presence of viral mutation(s) within the areas targeted by this assay, and inadequate number of viral copies(<138 copies/mL). A negative result must  be combined with clinical observations, patient history, and epidemiological information. The expected result is Negative.  Fact Sheet for Patients:  BloggerCourse.com  Fact Sheet for Healthcare Providers:  SeriousBroker.it  This test is no t yet approved or cleared by the [place] FDA and  has been authorized for detection and/or diagnosis of SARS-CoV-2 by FDA under an Emergency Use Authorization (EUA). This EUA will remain  in effect (meaning this test can be used) for the duration of the COVID-19 declaration under Section 564(b)(1) of the Act, 21 U.S.C.section 360bbb-3(b)(1), unless the authorization is terminated  or revoked sooner.       Influenza A by PCR NEGATIVE NEGATIVE Final   Influenza B by PCR NEGATIVE NEGATIVE Final     Comment: (NOTE) The Xpert Xpress SARS-CoV-2/FLU/RSV plus assay is intended as an aid in the diagnosis of influenza from Nasopharyngeal swab specimens and should not be used as a sole basis for treatment. Nasal washings and aspirates are unacceptable for Xpert Xpress SARS-CoV-2/FLU/RSV testing.  Fact Sheet for Patients: BloggerCourse.com  Fact Sheet for Healthcare Providers: SeriousBroker.it  This test is not yet approved or cleared by the [place] FDA and has been authorized for detection and/or diagnosis of SARS-CoV-2 by FDA under an Emergency Use Authorization (EUA). This EUA will remain in effect (meaning this test can be used) for the duration of the COVID-19 declaration under Section 564(b)(1) of the Act, 21 U.S.C. section 360bbb-3(b)(1), unless the authorization is terminated or revoked.  Performed at [facility], [address], [place], [place] [address]          Radiology Studies: MR THORACIC SPINE WO CONTRAST  Result Date: 07/10/2021 CLINICAL DATA:  Mid back pain.  Urinary and fecal incontinence. EXAM: MRI THORACIC SPINE WITHOUT CONTRAST TECHNIQUE: Multiplanar, multisequence MR imaging of the thoracic spine was performed. No intravenous contrast was administered. COMPARISON:  Thoracic spine radiographs 07/18/2015 FINDINGS: Alignment: Mild left convex curvature of the thoracic spine and mildly exaggerated thoracic kyphosis. No listhesis. Vertebrae: No fracture, suspicious marrow lesion, or significant marrow edema. Cord:  Normal signal and morphology. Paraspinal and other soft tissues: Unremarkable. Disc levels: Shallow central disc protrusion at T3-4 without stenosis. Patent neural foramina. IMPRESSION: Shallow disc protrusion at T3-4 without stenosis. Normal appearance of the thoracic spinal cord. Electronically Signed   By: Sebastian Ache M.D.   On: 07/10/2021 18:37   MR LUMBAR SPINE WO  CONTRAST  Result Date: 07/10/2021 CLINICAL DATA:  Chronic back pain prior back surgery. EXAM: MRI LUMBAR SPINE WITHOUT CONTRAST TECHNIQUE: Multiplanar, multisequence MR imaging of the lumbar spine was performed. No intravenous contrast was administered. COMPARISON:  None. FINDINGS: Segmentation:  Standard. Alignment:  Physiologic. Vertebrae:  No fracture, evidence of discitis, or bone lesion. Conus medullaris and cauda equina: Conus extends to the L1 level. Conus and cauda equina appear normal. Paraspinal and other soft tissues: Negative. Disc levels: T12-L1: No significant disc bulge. No neural foraminal stenosis. No central canal stenosis. L1-L2: Asymmetric disc protrusion with mild narrowing of the left lateral recess. No significant neural foraminal narrowing. L2-L3: Circumferential disc protrusion with narrowing of bilateral lateral recesses. No significant neural foraminal narrowing. L3-L4: Moderate circumferential disc protrusion and ligamentum flavum hypertrophy. Moderate spinal canal narrowing. No significant neural foraminal narrowing. L4-L5: Circumferential disc protrusion and ligamentum flavum hypertrophy. Mild narrowing of spinal canal. No significant neural foraminal narrowing. L5-S1: Broad-based circumferential disc protrusion and ligamentum flavum hypertrophy. Mild spinal canal narrowing. No significant neural foraminal narrowing. IMPRESSION: 1.  No evidence of acute  fracture or subluxation. 2.  Visualized cord and and cauda equina are within normal limits. 3. Multilevel degenerative disease with disc space narrowing and marginal osteophytes. No evidence of nerve impingement. Electronically Signed   By: Larose Hires D.O.   On: 07/10/2021 18:47        Scheduled Meds:  atorvastatin  80 mg Oral QPM   enoxaparin (LOVENOX) injection  40 mg Subcutaneous Q24H   levothyroxine  100 mcg Oral Q0600   methocarbamol  1,500 mg Oral QID   morphine  15 mg Oral BID   morphine  30 mg Oral QHS    nortriptyline  50 mg Oral QHS   oxcarbazepine  600 mg Oral BID   verapamil  120 mg Oral TID   Continuous Infusions:   LOS: 1 day    Time spent: 45 min    Silvano Bilis, MD [facility]   If 7PM-7AM, please contact night-coverage [email or web address] Password [email or web address] 07/11/2021, 9:12 AM

## 2021-07-11 NOTE — Consult Note (Signed)
Obstetrics & Gynecology Consult H&P   Consulting Department: Medicine  Consulting Physician: Kathrynn Running, MD  Consulting Question: Labial abscess   History of Present Illness: Patient is a 70 y.o. currently admitted to the medicine team for hyponatremia and new onset urinary and fecal incontinence.  She also reported a left labial abscess.  She reports he had previously had problems with labial abscess but that they generally resolve spontaneously although she had also previously undergone an I&D. 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  No trauma or inciting event noted by the patient.  She denies fevers or chills.  No postmenopausal bleeding.  No history of hidradenitis.     Review of Systems:10 point review of systems  Past Medical History:  Patient Active Problem List   Diagnosis Date Noted   Hyponatremia 07/10/2021   Weakness 07/10/2021   Seizures (HCC)    CHF (congestive heart failure) (HCC)    Morbid obesity with BMI of 45.0-49.9, adult (HCC)    Hypothyroidism    C7 cervical fracture (HCC) 05/13/2019    Last Assessment & Plan:  Formatting of this note is different from the original. Discharged from hospital on 09/06/19, summary: # Recurrent falls  Hx of C7 fracture: Patient was diagnosed with a C7 fracture during admission in October 2020. She had not had f/u with neurosurgery since that time. Neurosurgery was consulted while inpatient, obtain a flexion/extension films, and cleared her cervical collar. She does not need f/u. Did discuss frequent falls with patient, suspect this is partially due to deconditioning as well significant polypharmacy. Discussed starting to taper medications with patient, particularly recommended stopping meclizine and slowly tapering methocarbamol. Patient is very resistant to any changes to her regimen, feels that they are the only things that keep her from falling.      COPD (chronic obstructive pulmonary disease) (HCC) 12/21/2018    Last  Assessment & Plan:  Formatting of this note might be different from the original. Symptoms: Doing well at this time.    Hypertension 11/05/2012    Last Assessment & Plan:  Formatting of this note might be different from the original. Reports BP going up and down.  Low BP worries me with her tendency to fall.  I will lower her dose of verapamil.    Chronic pain syndrome 12/05/2010    Formatting of this note might be different from the original.  CHRONIC PAIN DUE TO post-laminectomy cevricalgia/ cervical disc disease with C3-4 foraminal narrowing.  ADJUNCT THERAPIES would like to get back into water therapy once hematoma heals  CONTRACT reviewed no sharing, no selling, honesty, importance of urine, database review, etc.   Last Assessment & Plan:  Formatting of this note might be different from the original.  I reviewed the State Controlled substance site and I am the only prescriber.    Refilled morphine ES 30 mg hs with MS ER 15 mg BID for one month beginning 08/24/20.     Past Surgical History:  Past Surgical History:  Procedure Laterality Date   GALLBLADDER SURGERY     HERNIA REPAIR      Gynecologic History:   Obstetric History: No obstetric history on file.  Family History:  Family History  Problem Relation Age of Onset   Hypertension Mother     Social History:  Social History   Socioeconomic History   Marital status: Married    Spouse name: Not on file   Number of children: Not on file   Years of education:  Not on file   Highest education level: Not on file  Occupational History   Not on file  Tobacco Use   Smoking status: Never   Smokeless tobacco: Not on file  Substance and Sexual Activity   Alcohol use: No   Drug use: No   Sexual activity: Never  Other Topics Concern   Not on file  Social History Narrative   Not on file   Social Determinants of Health   Financial Resource Strain: Not on file  Food Insecurity: Not on file  Transportation  Needs: Not on file  Physical Activity: Not on file  Stress: Not on file  Social Connections: Not on file  Intimate Partner Violence: Not on file    Allergies:  Allergies  Allergen Reactions   Benadryl [Diphenhydramine] Anaphylaxis   Celecoxib Other (See Comments)    Syncopal episode   Clindamycin Hives   Fluticasone-Salmeterol Other (See Comments) and Shortness Of Breath    Other reaction(s): Unknown Pt reports she uses Flovent every day, ordered by physician    Gabapentin Shortness Of Breath   Heparin Other (See Comments) and Swelling    Other Reaction: SKIN RXN Other Reaction: SKIN RXN (rash) confirmed NOT HIT - tolerated heparin when bridging warfarin for PE in 2019. Tolerated lovenox during hospitalization in 2021.    Rofecoxib     Syncopal episode   Mometasone Other (See Comments)    Nosebleed - nasal spray   Other Rash    Coban   Butalbital-Apap-Caffeine Other (See Comments)    Other reaction(s): Unknown   Latex Other (See Comments)    Other reaction(s): Unknown   Doxycycline Other (See Comments)    "Reflux"    Pantoprazole Nausea Only    Medications: Prior to Admission medications   Medication Sig Start Date End Date Taking? Authorizing Provider  atorvastatin (LIPITOR) 80 MG tablet Take 80 mg by mouth every evening. 05/15/21  Yes [provider]  diazepam (VALIUM) 5 MG tablet Take 5 mg by mouth every 12 (twelve) hours as needed for anxiety. 06/11/21  Yes [provider]  levothyroxine (SYNTHROID) 100 MCG tablet Take 100 mcg by mouth daily. 05/03/21  Yes [provider]  methocarbamol (ROBAXIN) 500 MG tablet Take 1,500 mg by mouth 4 (four) times daily. 05/23/21  Yes [provider]  morphine (MS CONTIN) 15 MG 12 hr tablet Take 15 mg by mouth every 12 (twelve) hours. 06/26/21  Yes [provider]  morphine (MS CONTIN) 30 MG 12 hr tablet Take 30 mg by mouth at bedtime. 06/26/21  Yes [provider]  nortriptyline  (PAMELOR) 25 MG capsule Take 50 mg by mouth at bedtime. 07/03/21  Yes [provider]  oxcarbazepine (TRILEPTAL) 600 MG tablet Take 600 mg by mouth 2 (two) times daily. 05/12/21  Yes [provider]  verapamil (CALAN) 120 MG tablet Take 120 mg by mouth 3 (three) times daily. 05/14/21  Yes [provider]  meclizine (ANTIVERT) 25 MG tablet Take 25 mg by mouth 3 (three) times daily as needed for dizziness. 06/21/21   [provider]    Physical Exam Vitals: Blood pressure (!) 153/74, pulse 78, temperature 98.7 F (37.1 C), temperature source Oral, resp. rate 16, height 5\' 4"  (1.626 m), weight 127 kg, SpO2 95 %.  General: NAD HEENT: normocephalic, anicteric Pulmonary: No increased work of breathing Genitourinary: The left labia major appears erythematous, there is minimal induration, no fluctuance or appreciable fluid collection noted. Extremities: no edema, erythema, or tenderness  Neurologic: Grossly intact Psychiatric: mood appropriate, affect full  Labs: Results for orders placed or performed during the hospital encounter of 07/10/21 (from the past 72 hour(s))  Comprehensive metabolic panel     Status: Abnormal   Collection Time: 07/10/21 11:08 AM  Result Value Ref Range   Sodium 125 (L) 135 - 145 mmol/L   Potassium 4.0 3.5 - 5.1 mmol/L   Chloride 96 (L) 98 - 111 mmol/L   CO2 22 22 - 32 mmol/L   Glucose, Bld 103 (H) 70 - 99 mg/dL    Comment: Glucose reference range applies only to samples taken after fasting for at least 8 hours.   BUN 5 (L) 8 - 23 mg/dL   Creatinine, Ser 1.19 0.44 - 1.00 mg/dL   Calcium 8.5 (L) 8.9 - 10.3 mg/dL   Total Protein 7.0 6.5 - 8.1 g/dL   Albumin 3.8 3.5 - 5.0 g/dL   AST 16 15 - 41 U/L   ALT 16 0 - 44 U/L   Alkaline Phosphatase 113 38 - 126 U/L   Total Bilirubin 0.5 0.3 - 1.2 mg/dL   GFR, Estimated >14 >78 mL/min    Comment: (NOTE) Calculated using the CKD-EPI Creatinine Equation (2021)    Anion gap 7 5 - 15     Comment: Performed at [facility], [address] [place], [place] [address]  CBC with Differential     Status: Abnormal   Collection Time: 07/10/21 11:08 AM  Result Value Ref Range   WBC 5.0 4.0 - 10.5 K/uL   RBC 4.26 3.87 - 5.11 MIL/uL   Hemoglobin 10.6 (L) 12.0 - 15.0 g/dL   HCT 13.0 (L) 86.5 - 78.4 %   MCV 78.6 (L) 80.0 - 100.0 fL   MCH 24.9 (L) 26.0 - 34.0 pg   MCHC 31.6 30.0 - 36.0 g/dL   RDW 69.6 (H) 29.5 - 28.4 %   Platelets 165 150 - 400 K/uL   nRBC 0.0 0.0 - 0.2 %   Neutrophils Relative % 84 %   Neutro Abs 4.2 1.7 - 7.7 K/uL   Lymphocytes Relative 10 %   Lymphs Abs 0.5 (L) 0.7 - 4.0 K/uL   Monocytes Relative 6 %   Monocytes Absolute 0.3 0.1 - 1.0 K/uL   Eosinophils Relative 0 %   Eosinophils Absolute 0.0 0.0 - 0.5 K/uL   Basophils Relative 0 %   Basophils Absolute 0.0 0.0 - 0.1 K/uL   Immature Granulocytes 0 %   Abs Immature Granulocytes 0.01 0.00 - 0.07 K/uL    Comment: Performed at [facility], [address] [place], [place] [address]  Lactic acid, plasma     Status: None   Collection Time: 07/10/21 11:43 AM  Result Value Ref Range   Lactic Acid, Venous 1.1 0.5 - 1.9 mmol/L    Comment: Performed at [facility], [address] [place], [place] [address]  Urinalysis, Routine w reflex microscopic Urine, Clean Catch     Status: Abnormal   Collection Time: 07/10/21 12:38 PM  Result Value Ref Range   Color, Urine YELLOW YELLOW   APPearance CLEAR CLEAR   Specific Gravity, Urine 1.010 1.005 - 1.030   pH 7.5 5.0 - 8.0   Glucose, UA NEGATIVE NEGATIVE mg/dL   Hgb urine dipstick SMALL (A) NEGATIVE   Bilirubin Urine NEGATIVE NEGATIVE   Ketones, ur NEGATIVE NEGATIVE mg/dL   Protein, ur NEGATIVE NEGATIVE mg/dL   Nitrite NEGATIVE NEGATIVE   Leukocytes,Ua NEGATIVE NEGATIVE  Comment: Performed at [facility], [address] [place], [place] [address]  Resp Panel by RT-PCR (Flu A&B, Covid) Nasopharyngeal Swab     Status: None    Collection Time: 07/10/21 12:38 PM   Specimen: Nasopharyngeal Swab; Nasopharyngeal(NP) swabs in vial transport medium  Result Value Ref Range   SARS Coronavirus 2 by RT PCR NEGATIVE NEGATIVE    Comment: (NOTE) SARS-CoV-2 target nucleic acids are NOT DETECTED.  The SARS-CoV-2 RNA is generally detectable in upper respiratory specimens during the acute phase of infection. The lowest concentration of SARS-CoV-2 viral copies this assay can detect is 138 copies/mL. A negative result does not preclude SARS-Cov-2 infection and should not be used as the sole basis for treatment or other patient management decisions. A negative result may occur with  improper specimen collection/handling, submission of specimen other than nasopharyngeal swab, presence of viral mutation(s) within the areas targeted by this assay, and inadequate number of viral copies(<138 copies/mL). A negative result must be combined with clinical observations, patient history, and epidemiological information. The expected result is Negative.  Fact Sheet for Patients:  BloggerCourse.com  Fact Sheet for Healthcare Providers:  SeriousBroker.it  This test is no t yet approved or cleared by the [place] FDA and  has been authorized for detection and/or diagnosis of SARS-CoV-2 by FDA under an Emergency Use Authorization (EUA). This EUA will remain  in effect (meaning this test can be used) for the duration of the COVID-19 declaration under Section 564(b)(1) of the Act, 21 U.S.C.section 360bbb-3(b)(1), unless the authorization is terminated  or revoked sooner.       Influenza A by PCR NEGATIVE NEGATIVE   Influenza B by PCR NEGATIVE NEGATIVE    Comment: (NOTE) The Xpert Xpress SARS-CoV-2/FLU/RSV plus assay is intended as an aid in the diagnosis of influenza from Nasopharyngeal swab specimens and should not be used as a sole basis for treatment. Nasal washings  and aspirates are unacceptable for Xpert Xpress SARS-CoV-2/FLU/RSV testing.  Fact Sheet for Patients: BloggerCourse.com  Fact Sheet for Healthcare Providers: SeriousBroker.it  This test is not yet approved or cleared by the [place] FDA and has been authorized for detection and/or diagnosis of SARS-CoV-2 by FDA under an Emergency Use Authorization (EUA). This EUA will remain in effect (meaning this test can be used) for the duration of the COVID-19 declaration under Section 564(b)(1) of the Act, 21 U.S.C. section 360bbb-3(b)(1), unless the authorization is terminated or revoked.  Performed at [facility], [address] [place], [place] [address]   Urinalysis, Microscopic (reflex)     Status: None   Collection Time: 07/10/21 12:38 PM  Result Value Ref Range   RBC / HPF 0-5 0 - 5 RBC/hpf   WBC, UA 0-5 0 - 5 WBC/hpf   Bacteria, UA NONE SEEN NONE SEEN   Squamous Epithelial / LPF NONE SEEN 0 - 5   Mucus PRESENT     Comment: Performed at [facility], [address] [place], [place] [address]  HIV Antibody (routine testing w rflx)     Status: None   Collection Time: 07/10/21  2:57 PM  Result Value Ref Range   HIV Screen 4th Generation wRfx Non Reactive Non Reactive    Comment: Performed at [facility], [address], [place], [place] [address]  Brain natriuretic peptide     Status: Abnormal   Collection Time: 07/10/21  8:17 PM  Result Value Ref Range   B Natriuretic Peptide 201.6 (H) 0.0 -  100.0 pg/mL    Comment: Performed at [facility], [address] [place], [place] [address]    Imaging MR THORACIC SPINE WO CONTRAST  Result Date: 07/10/2021 CLINICAL DATA:  Mid back pain.  Urinary and fecal incontinence. EXAM: MRI THORACIC SPINE WITHOUT CONTRAST TECHNIQUE: Multiplanar, multisequence MR imaging of the thoracic spine was performed. No intravenous contrast was administered.  COMPARISON:  Thoracic spine radiographs 07/18/2015 FINDINGS: Alignment: Mild left convex curvature of the thoracic spine and mildly exaggerated thoracic kyphosis. No listhesis. Vertebrae: No fracture, suspicious marrow lesion, or significant marrow edema. Cord:  Normal signal and morphology. Paraspinal and other soft tissues: Unremarkable. Disc levels: Shallow central disc protrusion at T3-4 without stenosis. Patent neural foramina. IMPRESSION: Shallow disc protrusion at T3-4 without stenosis. Normal appearance of the thoracic spinal cord. Electronically Signed   By: Sebastian Ache M.D.   On: 07/10/2021 18:37   MR LUMBAR SPINE WO CONTRAST  Result Date: 07/10/2021 CLINICAL DATA:  Chronic back pain prior back surgery. EXAM: MRI LUMBAR SPINE WITHOUT CONTRAST TECHNIQUE: Multiplanar, multisequence MR imaging of the lumbar spine was performed. No intravenous contrast was administered. COMPARISON:  None. FINDINGS: Segmentation:  Standard. Alignment:  Physiologic. Vertebrae:  No fracture, evidence of discitis, or bone lesion. Conus medullaris and cauda equina: Conus extends to the L1 level. Conus and cauda equina appear normal. Paraspinal and other soft tissues: Negative. Disc levels: T12-L1: No significant disc bulge. No neural foraminal stenosis. No central canal stenosis. L1-L2: Asymmetric disc protrusion with mild narrowing of the left lateral recess. No significant neural foraminal narrowing. L2-L3: Circumferential disc protrusion with narrowing of bilateral lateral recesses. No significant neural foraminal narrowing. L3-L4: Moderate circumferential disc protrusion and ligamentum flavum hypertrophy. Moderate spinal canal narrowing. No significant neural foraminal narrowing. L4-L5: Circumferential disc protrusion and ligamentum flavum hypertrophy. Mild narrowing of spinal canal. No significant neural foraminal narrowing. L5-S1: Broad-based circumferential disc protrusion and ligamentum flavum hypertrophy. Mild spinal  canal narrowing. No significant neural foraminal narrowing. IMPRESSION: 1.  No evidence of acute fracture or subluxation. 2.  Visualized cord and and cauda equina are within normal limits. 3. Multilevel degenerative disease with disc space narrowing and marginal osteophytes. No evidence of nerve impingement. Electronically Signed   By: Larose Hires D.O.   On: 07/10/2021 18:47    Assessment: 70 y.o. hyponatremia, left labial cellulitis  Plan: 1) Left labial cellulitis  - WBC normal on admission will continue to trend - no drainage to culture at this time - continue Bactrim DS for 10 day total treatment course - will continue to follow, if drainable fluid collection develops we discussed bedside I&D vs under sedation.  Her prior I&D was done under sedation with part of the reasoning being positioning given her limited mobility and back pain  Vena Austria, MD, [facility], [facility] 07/11/2021, 1:24 PM

## 2021-07-11 NOTE — Evaluation (Signed)
Physical Therapy Evaluation Patient Details Name: Gabriela Miller MRN: [id number] DOB: 1951-06-30 Today's Date: 07/11/2021  History of Present Illness  Gabriela Miller is a 70 y.o. female with medical history significant for chronic pain syndrome, history of prior back surgery, morbid obesity, seizure disorder, CHF and COPD who presents to the ER via EMS for evaluation of multiple complaints.  She gives a 2-day history of urinary and fecal incontinence.  She has a history of chronic low back pain which she said is worse.  She has had a recent fall and says she rolled out of bed landing on her side. Lumbar X-rays show mild degenerative changes, no acute fracture; Labs show hyponatremia. BP was elevated; Patient admitted for further evaluation;  Clinical Impression  70 yo Female came to ED with increased back pain as well as incontinence. She lives at home with her husband. She reports he is able to help her some around the house as she needs it. She was using a rollator for short distance ambulation. They do have a ramped entrance and single story home. Patient currently exhibits increased weakness in BLE. She required min-mod A for bed mobility with bed rails. She does not have bed rails at home. She required min A for sit<>stand transfer with RW. She was able to ambulate approximately 15 feet with RW with crouched gait, short step length and slower gait speed. During standing, she reports increased dizziness. Patient does have a recent fall history. She exhibits fair to poor balance. Patient would benefit from additional skilled PT Intervention to improve strength, balance and mobility. Her husband is older and would be unable to provide significant assistance at home. Therefore recommend short term rehab for strengthening and mobility; Will continue to update discharge plan as patient progresses;      Recommendations for follow up therapy are one component of a multi-disciplinary discharge planning process,  led by the attending physician.  Recommendations may be updated based on patient status, additional functional criteria and insurance authorization.  Follow Up Recommendations Skilled nursing-short term rehab (<3 hours/day)    Assistance Recommended at Discharge Frequent or constant Supervision/Assistance  Functional Status Assessment Patient has had a recent decline in their functional status and demonstrates the ability to make significant improvements in function in a reasonable and predictable amount of time.  Equipment Recommendations       Recommendations for Other Services       Precautions / Restrictions Precautions Precautions: Fall Restrictions Weight Bearing Restrictions: No      Mobility  Bed Mobility Overal bed mobility: Needs Assistance Bed Mobility: Rolling;Supine to Sit;Sit to Supine Rolling: Mod assist   Supine to sit: Min assist Sit to supine: Mod assist   General bed mobility comments: Required assistance for LE positoning and trunk control; increased time/effort    Transfers Overall transfer level: Needs assistance Equipment used: Rolling walker (2 wheels) Transfers: Sit to/from Stand Sit to Stand: Min assist           General transfer comment: with cues for hand placement;    Ambulation/Gait Ambulation/Gait assistance: Min assist Gait Distance (Feet): 15 Feet Assistive device: Rolling walker (2 wheels)   Gait velocity: decreased     General Gait Details: ambulates with flexed hip/knee in stance, short step length, slower gait speed, reports mild dizziness;  Stairs            Wheelchair Mobility    Modified Rankin (Stroke Patients Only)       Balance Overall balance assessment: Needs  assistance Sitting-balance support: Feet supported;No upper extremity supported Sitting balance-Leahy Scale: Fair     Standing balance support: Bilateral upper extremity supported;During functional activity Standing balance-Leahy Scale:  Poor Standing balance comment: Requires CGA in stand with RW for safety;                             Pertinent Vitals/Pain Pain Assessment: 0-10 Pain Score: 7  Pain Location: all over Pain Descriptors / Indicators: Aching;Sore Pain Intervention(s): Limited activity within patient's tolerance;Repositioned    Home Living Family/patient expects to be discharged to:: Private residence Living Arrangements: Spouse/significant other Available Help at Discharge: Family;Available 24 hours/day Type of Home: House Home Access: Ramped entrance       Home Layout: One level Home Equipment: Rollator (4 wheels);BSC/3in1;Wheelchair - manual;Grab bars - toilet;Grab bars - tub/shower;Shower Counsellor (2 wheels);Cane - single point      Prior Function Prior Level of Function : Needs assist       Physical Assist : Mobility (physical)     Mobility Comments: Patient uses rollator and will sometimes use a wheelchair ADLs Comments: husband helps with cooking and some cleaning;     Hand Dominance   Dominant Hand: Right    Extremity/Trunk Assessment   Upper Extremity Assessment Upper Extremity Assessment: Generalized weakness    Lower Extremity Assessment Lower Extremity Assessment: RLE deficits/detail;LLE deficits/detail RLE Deficits / Details: grossly 3+/5 RLE Sensation: decreased light touch LLE Deficits / Details: Grossly 3-/5 LLE Sensation: decreased light touch    Cervical / Trunk Assessment Cervical / Trunk Assessment:  (past cervical surgery, unable to move cervical spine; stiff posture)  Communication   Communication: No difficulties  Cognition Arousal/Alertness: Awake/alert Behavior During Therapy: WFL for tasks assessed/performed Overall Cognitive Status: Within Functional Limits for tasks assessed                                          General Comments General comments (skin integrity, edema, etc.): has wound on labia     Exercises     Assessment/Plan    PT Assessment Patient needs continued PT services  PT Problem List Decreased strength;Decreased mobility;Decreased safety awareness;Decreased range of motion;Decreased activity tolerance;Decreased balance;Pain       PT Treatment Interventions Therapeutic exercise;Gait training;Balance training;Neuromuscular re-education;Functional mobility training;Therapeutic activities;Patient/family education;DME instruction    PT Goals (Current goals can be found in the Care Plan section)  Acute Rehab PT Goals Patient Stated Goal: to get stronger PT Goal Formulation: With patient Time For Goal Achievement: 07/25/21    Frequency Min 2X/week   Barriers to discharge   has ramped entrance; Lives with husband who is able to help her but he is older and is unable to provide significant support    Co-evaluation               AM-PAC PT "6 Clicks" Mobility  Outcome Measure Help needed turning from your back to your side while in a flat bed without using bedrails?: A Lot Help needed moving from lying on your back to sitting on the side of a flat bed without using bedrails?: A Lot Help needed moving to and from a bed to a chair (including a wheelchair)?: A Little Help needed standing up from a chair using your arms (e.g., wheelchair or bedside chair)?: A Little Help needed to walk in hospital room?:  A Little Help needed climbing 3-5 steps with a railing? : A Lot 6 Click Score: 15    End of Session Equipment Utilized During Treatment: Gait belt Activity Tolerance: Patient tolerated treatment well;Patient limited by pain Patient left: in bed;with call bell/phone within reach;with bed alarm set Nurse Communication: Mobility status PT Visit Diagnosis: Unsteadiness on feet (R26.81);Other abnormalities of gait and mobility (R26.89);Muscle weakness (generalized) (M62.81);History of falling (Z91.81);Difficulty in walking, not elsewhere classified (R26.2)    Time:  5945-8592 PT Time Calculation (min) (ACUTE ONLY): 59 min   Charges:   PT Evaluation $PT Eval Low Complexity: 1 Low            Deleon Passe PT, DPT 07/11/2021, 3:41 PM

## 2021-07-11 NOTE — Plan of Care (Signed)
  Problem: Clinical Measurements: Goal: Ability to maintain clinical measurements within normal limits will improve Outcome: Progressing Goal: Respiratory complications will improve Outcome: Progressing   Problem: Coping: Goal: Level of anxiety will decrease Outcome: Progressing   Problem: Pain Managment: Goal: General experience of comfort will improve Outcome: Progressing   Problem: Safety: Goal: Ability to remain free from injury will improve Outcome: Progressing

## 2021-07-12 DIAGNOSIS — E871 Hypo-osmolality and hyponatremia: Secondary | ICD-10-CM | POA: Diagnosis not present

## 2021-07-12 LAB — CBC
HCT: 32.4 % — ABNORMAL LOW (ref 36.0–46.0)
Hemoglobin: 10.2 g/dL — ABNORMAL LOW (ref 12.0–15.0)
MCH: 25 pg — ABNORMAL LOW (ref 26.0–34.0)
MCHC: 31.5 g/dL (ref 30.0–36.0)
MCV: 79.4 fL — ABNORMAL LOW (ref 80.0–100.0)
Platelets: 179 10*3/uL (ref 150–400)
RBC: 4.08 MIL/uL (ref 3.87–5.11)
RDW: 16.8 % — ABNORMAL HIGH (ref 11.5–15.5)
WBC: 5.6 10*3/uL (ref 4.0–10.5)
nRBC: 0 % (ref 0.0–0.2)

## 2021-07-12 LAB — BASIC METABOLIC PANEL
Anion gap: 8 (ref 5–15)
BUN: 21 mg/dL (ref 8–23)
CO2: 24 mmol/L (ref 22–32)
Calcium: 8.3 mg/dL — ABNORMAL LOW (ref 8.9–10.3)
Chloride: 102 mmol/L (ref 98–111)
Creatinine, Ser: 1 mg/dL (ref 0.44–1.00)
GFR, Estimated: >60 mL/min (ref >60)
Glucose, Bld: 100 mg/dL — ABNORMAL HIGH (ref 70–99)
Potassium: 4.8 mmol/L (ref 3.5–5.1)
Sodium: 134 mmol/L — ABNORMAL LOW (ref 135–145)

## 2021-07-12 MED ORDER — NORTRIPTYLINE HCL 25 MG PO CAPS
25.0000 mg | ORAL_CAPSULE | Freq: Every day | ORAL | Status: AC
Start: 2021-07-12 — End: ?

## 2021-07-12 MED ORDER — NORTRIPTYLINE HCL 25 MG PO CAPS
25.0000 mg | ORAL_CAPSULE | Freq: Every day | ORAL | Status: DC
  Filled 2021-07-12: qty 1

## 2021-07-12 MED ORDER — FERROUS SULFATE 325 (65 FE) MG PO TABS
325.0000 mg | ORAL_TABLET | ORAL | Status: DC
  Administered 2021-07-12: 325 mg via ORAL
  Filled 2021-07-12: qty 1

## 2021-07-12 MED ORDER — SULFAMETHOXAZOLE-TRIMETHOPRIM 800-160 MG PO TABS: 1.0000 | ORAL_TABLET | Freq: Two times a day (BID) | ORAL | 0 refills | Status: AC

## 2021-07-12 NOTE — Progress Notes (Signed)
Obstetric and Gynecology  Subjective  Did well overnight, no drainage from area, still some tenderness.  No fevers. No chills.   Objective  Vital signs in last 24 hours: Temp:  [98 F (36.7 C)-98.7 F (37.1 C)] 98 F (36.7 C) (12/11 0448) Pulse Rate:  [65-78] 65 (12/11 0448) Resp:  [16-20] 20 (12/11 0448) BP: (115-153)/(65-74) 119/65 (12/11 0448) SpO2:  [94 %-96 %] 96 % (12/11 0448) Last BM Date: 07/11/21   Intake/Output Summary (Last 24 hours) at 07/12/2021 0086 Last data filed at 07/12/2021 7619 Gross per 24 hour  Intake 1080 ml  Output 1600 ml  Net -520 ml    General: NAD Pulmonary: no increased work of breathing Genitourinary:  External: left labia majora still with some erythema and swelling.  Overall slightly less erythematous than yesterday.  No significant induration, no fluctuance.  Normal urethral meatus, normal Bartholin's and Skene's glands.    Vagina: Normal vaginal mucosa, no evidence of prolapse.    Cervix: Grossly normal in appearance, no bleeding  Uterus: Non-enlarged, mobile, normal contour.  No CMT  Adnexa: ovaries non-enlarged, no adnexal masses  Rectal: deferred  Lymphatic: no evidence of inguinal lymphadenopathy  Extremities:  Labs: Results for orders placed or performed during the hospital encounter of 07/10/21 (from the past 24 hour(s))  Sodium, urine, random     Status: None   Collection Time: 07/11/21  8:54 AM  Result Value Ref Range   Sodium, Ur 78 mmol/L  Basic metabolic panel     Status: Abnormal   Collection Time: 07/11/21 10:43 AM  Result Value Ref Range   Sodium 133 (L) 135 - 145 mmol/L   Potassium 3.1 (L) 3.5 - 5.1 mmol/L   Chloride 98 98 - 111 mmol/L   CO2 26 22 - 32 mmol/L   Glucose, Bld 83 70 - 99 mg/dL   BUN 12 8 - 23 mg/dL   Creatinine, Ser 5.09 0.44 - 1.00 mg/dL   Calcium 8.3 (L) 8.9 - 10.3 mg/dL   GFR, Estimated >32 >67 mL/min   Anion gap 9 5 - 15  TSH     Status: None   Collection Time: 07/11/21 10:43 AM  Result  Value Ref Range   TSH 3.223 0.350 - 4.500 uIU/mL  Osmolality     Status: None   Collection Time: 07/11/21 10:43 AM  Result Value Ref Range   Osmolality 280 275 - 295 mOsm/kg  Ferritin     Status: None   Collection Time: 07/11/21 10:43 AM  Result Value Ref Range   Ferritin 13 11 - 307 ng/mL  Iron and TIBC     Status: Abnormal   Collection Time: 07/11/21 10:43 AM  Result Value Ref Range   Iron 42 28 - 170 ug/dL   TIBC 124 580 - 998 ug/dL   Saturation Ratios 10 (L) 10.4 - 31.8 %   UIBC 384 ug/dL  Magnesium     Status: None   Collection Time: 07/11/21 10:43 AM  Result Value Ref Range   Magnesium 2.3 1.7 - 2.4 mg/dL  Osmolality, urine     Status: None   Collection Time: 07/11/21 12:20 PM  Result Value Ref Range   Osmolality, Ur 385 300 - 900 mOsm/kg  Basic metabolic panel     Status: Abnormal   Collection Time: 07/12/21  5:01 AM  Result Value Ref Range   Sodium 134 (L) 135 - 145 mmol/L   Potassium 4.8 3.5 - 5.1 mmol/L   Chloride 102 98 - 111  mmol/L   CO2 24 22 - 32 mmol/L   Glucose, Bld 100 (H) 70 - 99 mg/dL   BUN 21 8 - 23 mg/dL   Creatinine, Ser 7.82 0.44 - 1.00 mg/dL   Calcium 8.3 (L) 8.9 - 10.3 mg/dL   GFR, Estimated >95 >62 mL/min   Anion gap 8 5 - 15  CBC     Status: Abnormal   Collection Time: 07/12/21  5:01 AM  Result Value Ref Range   WBC 5.6 4.0 - 10.5 K/uL   RBC 4.08 3.87 - 5.11 MIL/uL   Hemoglobin 10.2 (L) 12.0 - 15.0 g/dL   HCT 13.0 (L) 86.5 - 78.4 %   MCV 79.4 (L) 80.0 - 100.0 fL   MCH 25.0 (L) 26.0 - 34.0 pg   MCHC 31.5 30.0 - 36.0 g/dL   RDW 69.6 (H) 29.5 - 28.4 %   Platelets 179 150 - 400 K/uL   nRBC 0.0 0.0 - 0.2 %    Cultures: Results for orders placed or performed during the hospital encounter of 07/10/21  Resp Panel by RT-PCR (Flu A&B, Covid) Nasopharyngeal Swab     Status: None   Collection Time: 07/10/21 12:38 PM   Specimen: Nasopharyngeal Swab; Nasopharyngeal(NP) swabs in vial transport medium  Result Value Ref Range Status   SARS  Coronavirus 2 by RT PCR NEGATIVE NEGATIVE Final    Comment: (NOTE) SARS-CoV-2 target nucleic acids are NOT DETECTED.  The SARS-CoV-2 RNA is generally detectable in upper respiratory specimens during the acute phase of infection. The lowest concentration of SARS-CoV-2 viral copies this assay can detect is 138 copies/mL. A negative result does not preclude SARS-Cov-2 infection and should not be used as the sole basis for treatment or other patient management decisions. A negative result may occur with  improper specimen collection/handling, submission of specimen other than nasopharyngeal swab, presence of viral mutation(s) within the areas targeted by this assay, and inadequate number of viral copies(<138 copies/mL). A negative result must be combined with clinical observations, patient history, and epidemiological information. The expected result is Negative.  Fact Sheet for Patients:  BloggerCourse.com  Fact Sheet for Healthcare Providers:  SeriousBroker.it  This test is no t yet approved or cleared by the [place] FDA and  has been authorized for detection and/or diagnosis of SARS-CoV-2 by FDA under an Emergency Use Authorization (EUA). This EUA will remain  in effect (meaning this test can be used) for the duration of the COVID-19 declaration under Section 564(b)(1) of the Act, 21 U.S.C.section 360bbb-3(b)(1), unless the authorization is terminated  or revoked sooner.       Influenza A by PCR NEGATIVE NEGATIVE Final   Influenza B by PCR NEGATIVE NEGATIVE Final    Comment: (NOTE) The Xpert Xpress SARS-CoV-2/FLU/RSV plus assay is intended as an aid in the diagnosis of influenza from Nasopharyngeal swab specimens and should not be used as a sole basis for treatment. Nasal washings and aspirates are unacceptable for Xpert Xpress SARS-CoV-2/FLU/RSV testing.  Fact Sheet for  Patients: BloggerCourse.com  Fact Sheet for Healthcare Providers: SeriousBroker.it  This test is not yet approved or cleared by the [place] FDA and has been authorized for detection and/or diagnosis of SARS-CoV-2 by FDA under an Emergency Use Authorization (EUA). This EUA will remain in effect (meaning this test can be used) for the duration of the COVID-19 declaration under Section 564(b)(1) of the Act, 21 U.S.C. section 360bbb-3(b)(1), unless the authorization is terminated or revoked.  Performed at [facility], [id number]  [address] [place], [place] [address]     Imaging:  Assessment   70 y.o. with left labial cellulitis  Plan    1) Cellulitis - Slightly less erythematous today, still without significant induration or fluctuance or drainable fluid collection. - continue 10 day course of bactrim DS post discharge - sitz baths and warm compress prn - follow up sometime mid this week for re-evaluation in office to see if drainable fluid collection develops

## 2021-07-12 NOTE — Care Management CC44 (Signed)
Condition Code 44 Documentation Completed  Patient Details  Name: Gabriela Miller MRN: [id number] Date of Birth: 09-15-1950   Condition Code 44 given:  Yes Patient signature on Condition Code 44 notice:  Yes Documentation of 2 MD's agreement:  Yes Code 44 added to claim:  Yes  Thoroughly reviewed with pt and she has refused to signed. Pt given a copy.  Luvenia Redden, RN 07/12/2021, 10:37 AM

## 2021-07-12 NOTE — Discharge Summary (Signed)
Gabriela Miller JEH:[id number] DOB: [birth year] DOA: 07/10/2021  PCP: Lance Bosch, MD  Admit date: 07/10/2021 Discharge date: 07/12/2021  Time spent: 45 minutes  Recommendations for Outpatient Follow-up:  Gyn f/u 1 week Pcp and neurology f/u Check of sodium 1 week  Needs further w/u for iron deficiency anemia    Discharge Diagnoses:  Principal Problem:   Hyponatremia Active Problems:   Weakness   Seizures (HCC)   CHF (congestive heart failure) (HCC)   Morbid obesity with BMI of 45.0-49.9, adult (HCC)   Hypothyroidism   Discharge Condition: stable  Diet recommendation: heart healthy  Filed Weights   07/10/21 1045  Weight: 127 kg    History of present illness:  Gabriela Miller is a 70 y.o. female with medical history significant for chronic pain syndrome, history of prior back surgery, morbid obesity, seizure disorder, CHF and COPD who presents to the ER via EMS for evaluation of multiple complaints. She gives a 2-day history of urinary and fecal incontinence.  She has a history of chronic low back pain which she said is worse.  She has had a recent fall and says she rolled out of bed landing on her side.  She rates her pain a 5 x 10 in intensity at its worst. She also complains of an abscess on her labia and denies feeling well. She denies having any nausea, no vomiting, no changes in her bowel habits, no urinary frequency, no nocturia, no dysuria, no headache, no dizziness, no lightheadedness, no palpitations, no diaphoresis, no headache, no focal deficit or blurred vision.  Hospital Course:  # Hyponatremia Baseline is low 130s likely 2/2 meds particularly oxcarbazepine. Here sodium 125 on admission though improved to 133 on repeat and stable there essentially w/o intervention and sodium this morning is 134. Tsh wnl - will continue home meds for now though will decrease dose of nortryptyline, provided hyponatremia remains mild, as patient says has been tried on other  antiepileptics and oxcarbazepine is what works best, though do advise outpt neurology f/u   # Fecal and urinary incontinence MRI of lumbar and thoracic spine w/o acute abnormalities, no signs cauda equina, no lower extremity neurologic symptoms. No diarrhea. Urinalysis not suggestive of infection. Now resolved. PVR wnl.   # Debility Uses walker at home. PT advising snf and patient is interested but did not have qualifying stay - HH pt/ot/rn    # Labial abscess Left upper labia majora - gyn has seen, advising continuing abx for now, f/u in their clinic 1 week - they have prescribed 10 days bactrim   # Chronic pain - home ms contin   # Chronic migraine - home verapamil, nortriptyline, meclizine, robaxin   # Insomnia - reducing dose nortryptyline   # HTN - home statin, verapamil   # GAD - home benzo   # Microcytic anemia Mild, no report of bleeding, iron studies consistent w/ iron deficiency. No report of bleeding - outpt f/u  Procedures: none   Consultations: gyn  Discharge Exam: Vitals:   07/12/21 0448 07/12/21 0842  BP: 119/65 125/73  Pulse: 65 73  Resp: 20 18  Temp: 98 F (36.7 C) 97.7 F (36.5 C)  SpO2: 96% 98%      General exam: Appears calm and comfortable  Respiratory system: Clear to auscultation. Respiratory effort normal. Cardiovascular system: S1 & S2 heard, RRR. No JVD, murmurs, rubs, gallops or clicks.  Gastrointestinal system: Abdomen is obese, soft and nontender. No organomegaly or masses felt. Normal bowel sounds heard.  Central nervous system: Alert and oriented. No focal neurological deficits. Extremities: Symmetric 5 x 5 power. Trace pedal edema Skin: see skin exam from yesterday. Perineum not assessed today Psychiatry: Judgement and insight appear normal. Mood & affect appropriate.   Discharge Instructions   Discharge Instructions     Diet Carb Modified   Complete by: As directed    Increase activity slowly   Complete by: As  directed       Allergies as of 07/12/2021       Reactions   Benadryl [diphenhydramine] Anaphylaxis   Celecoxib Other (See Comments)   Syncopal episode   Clindamycin Hives   Fluticasone-salmeterol Other (See Comments), Shortness Of Breath   Other reaction(s): Unknown Pt reports she uses Flovent every day, ordered by physician   Gabapentin Shortness Of Breath   Heparin Other (See Comments), Swelling   Other Reaction: SKIN RXN Other Reaction: SKIN RXN (rash) confirmed NOT HIT - tolerated heparin when bridging warfarin for PE in 2019. Tolerated lovenox during hospitalization in 2021.   Rofecoxib    Syncopal episode   Mometasone Other (See Comments)   Nosebleed - nasal spray   Other Rash   Coban   Butalbital-apap-caffeine Other (See Comments)   Other reaction(s): Unknown   Latex Other (See Comments)   Other reaction(s): Unknown   Doxycycline Other (See Comments)   "Reflux"    Pantoprazole Nausea Only        Medication List     TAKE these medications    atorvastatin 80 MG tablet Commonly known as: LIPITOR Take 80 mg by mouth every evening.   diazepam 5 MG tablet Commonly known as: VALIUM Take 5 mg by mouth every 12 (twelve) hours as needed for anxiety.   levothyroxine 100 MCG tablet Commonly known as: SYNTHROID Take 100 mcg by mouth daily.   meclizine 25 MG tablet Commonly known as: ANTIVERT Take 25 mg by mouth 3 (three) times daily as needed for dizziness.   methocarbamol 500 MG tablet Commonly known as: ROBAXIN Take 1,500 mg by mouth 4 (four) times daily.   morphine 15 MG 12 hr tablet Commonly known as: MS CONTIN Take 15 mg by mouth every 12 (twelve) hours.   morphine 30 MG 12 hr tablet Commonly known as: MS CONTIN Take 30 mg by mouth at bedtime.   nortriptyline 25 MG capsule Commonly known as: PAMELOR Take 1 capsule (25 mg total) by mouth at bedtime. What changed: how much to take   oxcarbazepine 600 MG tablet Commonly known as: TRILEPTAL Take  600 mg by mouth 2 (two) times daily.   sulfamethoxazole-trimethoprim 800-160 MG tablet Commonly known as: BACTRIM DS Take 1 tablet by mouth every 12 (twelve) hours for 10 days.   verapamil 120 MG tablet Commonly known as: CALAN Take 120 mg by mouth 3 (three) times daily.       Allergies  Allergen Reactions   Benadryl [Diphenhydramine] Anaphylaxis   Celecoxib Other (See Comments)    Syncopal episode   Clindamycin Hives   Fluticasone-Salmeterol Other (See Comments) and Shortness Of Breath    Other reaction(s): Unknown Pt reports she uses Flovent every day, ordered by physician    Gabapentin Shortness Of Breath   Heparin Other (See Comments) and Swelling    Other Reaction: SKIN RXN Other Reaction: SKIN RXN (rash) confirmed NOT HIT - tolerated heparin when bridging warfarin for PE in 2019. Tolerated lovenox during hospitalization in 2021.    Rofecoxib     Syncopal episode   Mometasone  Other (See Comments)    Nosebleed - nasal spray   Other Rash    Coban   Butalbital-Apap-Caffeine Other (See Comments)    Other reaction(s): Unknown   Latex Other (See Comments)    Other reaction(s): Unknown   Doxycycline Other (See Comments)    "Reflux"    Pantoprazole Nausea Only    Follow-up Information     [facility] Follow up in 3 day(s).   Why: Any MD provider, procedure room (limited mobility needs lower table) Contact information: [address] [place] [address] [phone]         Lance Bosch, MD Follow up.   Specialty: Geriatric Medicine Contact information: [id number] [facility] [address] [phone]         Your neurologist Follow up.                   The results of significant diagnostics from this hospitalization (including imaging, microbiology, ancillary and laboratory) are listed below for reference.    Significant Diagnostic Studies: MR THORACIC SPINE WO  CONTRAST  Result Date: 07/10/2021 CLINICAL DATA:  Mid back pain.  Urinary and fecal incontinence. EXAM: MRI THORACIC SPINE WITHOUT CONTRAST TECHNIQUE: Multiplanar, multisequence MR imaging of the thoracic spine was performed. No intravenous contrast was administered. COMPARISON:  Thoracic spine radiographs 07/18/2015 FINDINGS: Alignment: Mild left convex curvature of the thoracic spine and mildly exaggerated thoracic kyphosis. No listhesis. Vertebrae: No fracture, suspicious marrow lesion, or significant marrow edema. Cord:  Normal signal and morphology. Paraspinal and other soft tissues: Unremarkable. Disc levels: Shallow central disc protrusion at T3-4 without stenosis. Patent neural foramina. IMPRESSION: Shallow disc protrusion at T3-4 without stenosis. Normal appearance of the thoracic spinal cord. Electronically Signed   By: Sebastian Ache M.D.   On: 07/10/2021 18:37   MR LUMBAR SPINE WO CONTRAST  Result Date: 07/10/2021 CLINICAL DATA:  Chronic back pain prior back surgery. EXAM: MRI LUMBAR SPINE WITHOUT CONTRAST TECHNIQUE: Multiplanar, multisequence MR imaging of the lumbar spine was performed. No intravenous contrast was administered. COMPARISON:  None. FINDINGS: Segmentation:  Standard. Alignment:  Physiologic. Vertebrae:  No fracture, evidence of discitis, or bone lesion. Conus medullaris and cauda equina: Conus extends to the L1 level. Conus and cauda equina appear normal. Paraspinal and other soft tissues: Negative. Disc levels: T12-L1: No significant disc bulge. No neural foraminal stenosis. No central canal stenosis. L1-L2: Asymmetric disc protrusion with mild narrowing of the left lateral recess. No significant neural foraminal narrowing. L2-L3: Circumferential disc protrusion with narrowing of bilateral lateral recesses. No significant neural foraminal narrowing. L3-L4: Moderate circumferential disc protrusion and ligamentum flavum hypertrophy. Moderate spinal canal narrowing. No significant  neural foraminal narrowing. L4-L5: Circumferential disc protrusion and ligamentum flavum hypertrophy. Mild narrowing of spinal canal. No significant neural foraminal narrowing. L5-S1: Broad-based circumferential disc protrusion and ligamentum flavum hypertrophy. Mild spinal canal narrowing. No significant neural foraminal narrowing. IMPRESSION: 1.  No evidence of acute fracture or subluxation. 2.  Visualized cord and and cauda equina are within normal limits. 3. Multilevel degenerative disease with disc space narrowing and marginal osteophytes. No evidence of nerve impingement. Electronically Signed   By: Larose Hires D.O.   On: 07/10/2021 18:47    Microbiology: Recent Results (from the past 240 hour(s))  Resp Panel by RT-PCR (Flu A&B, Covid) Nasopharyngeal Swab     Status: None   Collection Time: 07/10/21 12:38 PM   Specimen: Nasopharyngeal Swab; Nasopharyngeal(NP) swabs in vial transport  medium  Result Value Ref Range Status   SARS Coronavirus 2 by RT PCR NEGATIVE NEGATIVE Final    Comment: (NOTE) SARS-CoV-2 target nucleic acids are NOT DETECTED.  The SARS-CoV-2 RNA is generally detectable in upper respiratory specimens during the acute phase of infection. The lowest concentration of SARS-CoV-2 viral copies this assay can detect is 138 copies/mL. A negative result does not preclude SARS-Cov-2 infection and should not be used as the sole basis for treatment or other patient management decisions. A negative result may occur with  improper specimen collection/handling, submission of specimen other than nasopharyngeal swab, presence of viral mutation(s) within the areas targeted by this assay, and inadequate number of viral copies(<138 copies/mL). A negative result must be combined with clinical observations, patient history, and epidemiological information. The expected result is Negative.  Fact Sheet for Patients:  BloggerCourse.com  Fact Sheet for Healthcare  Providers:  SeriousBroker.it  This test is no t yet approved or cleared by the [place] FDA and  has been authorized for detection and/or diagnosis of SARS-CoV-2 by FDA under an Emergency Use Authorization (EUA). This EUA will remain  in effect (meaning this test can be used) for the duration of the COVID-19 declaration under Section 564(b)(1) of the Act, 21 U.S.C.section 360bbb-3(b)(1), unless the authorization is terminated  or revoked sooner.       Influenza A by PCR NEGATIVE NEGATIVE Final   Influenza B by PCR NEGATIVE NEGATIVE Final    Comment: (NOTE) The Xpert Xpress SARS-CoV-2/FLU/RSV plus assay is intended as an aid in the diagnosis of influenza from Nasopharyngeal swab specimens and should not be used as a sole basis for treatment. Nasal washings and aspirates are unacceptable for Xpert Xpress SARS-CoV-2/FLU/RSV testing.  Fact Sheet for Patients: BloggerCourse.com  Fact Sheet for Healthcare Providers: SeriousBroker.it  This test is not yet approved or cleared by the [place] FDA and has been authorized for detection and/or diagnosis of SARS-CoV-2 by FDA under an Emergency Use Authorization (EUA). This EUA will remain in effect (meaning this test can be used) for the duration of the COVID-19 declaration under Section 564(b)(1) of the Act, 21 U.S.C. section 360bbb-3(b)(1), unless the authorization is terminated or revoked.  Performed at [facility], [address] [place], [place] [address]      Labs: Basic Metabolic Panel: Recent Labs  Lab 07/10/21 1108 07/11/21 1043 07/12/21 0501  NA 125* 133* 134*  K 4.0 3.1* 4.8  CL 96* 98 102  CO2 22 26 24   GLUCOSE 103* 83 100*  BUN 5* 12 21  CREATININE 0.50 0.86 1.00  CALCIUM 8.5* 8.3* 8.3*  MG  --  2.3  --    Liver Function Tests: Recent Labs  Lab 07/10/21 1108  AST 16  ALT 16  ALKPHOS 113  BILITOT 0.5  PROT  7.0  ALBUMIN 3.8   No results for input(s): LIPASE, AMYLASE in the last 168 hours. No results for input(s): AMMONIA in the last 168 hours. CBC: Recent Labs  Lab 07/10/21 1108 07/12/21 0501  WBC 5.0 5.6  NEUTROABS 4.2  --   HGB 10.6* 10.2*  HCT 33.5* 32.4*  MCV 78.6* 79.4*  PLT 165 179   Cardiac Enzymes: No results for input(s): CKTOTAL, CKMB, CKMBINDEX, TROPONINI in the last 168 hours. BNP: BNP (last 3 results) Recent Labs    07/10/21 2017  BNP 201.6*    ProBNP (last 3 results) No results for input(s): PROBNP in the last 8760 hours.  CBG: No results for  input(s): GLUCAP in the last 168 hours.     Signed:  Silvano Bilis MD.  [facility] 07/12/2021, 12:39 PM

## 2021-07-12 NOTE — Progress Notes (Signed)
PROGRESS NOTE    Gabriela Miller  JIR:[id number] DOB: 07/14/1951 DOA: 07/10/2021 PCP: Lance Bosch, MD  Outpatient Specialists: neurology    Brief Narrative:   From admission hpi Gabriela Miller is a 70 y.o. female with medical history significant for chronic pain syndrome, history of prior back surgery, morbid obesity, seizure disorder, CHF and COPD who presents to the ER via EMS for evaluation of multiple complaints. She gives a 2-day history of urinary and fecal incontinence.  She has a history of chronic low back pain which she said is worse.  She has had a recent fall and says she rolled out of bed landing on her side.  She rates her pain a 5 x 10 in intensity at its worst. She also complains of an abscess on her labia and denies feeling well. She denies having any nausea, no vomiting, no changes in her bowel habits, no urinary frequency, no nocturia, no dysuria, no headache, no dizziness, no lightheadedness, no palpitations, no diaphoresis, no headache, no focal deficit or blurred vision.   Assessment & Plan:   Principal Problem:   Hyponatremia Active Problems:   Weakness   Seizures (HCC)   CHF (congestive heart failure) (HCC)   Morbid obesity with BMI of 45.0-49.9, adult (HCC)   Hypothyroidism  # Hyponatremia Baseline is low 130s likely 2/2 meds particularly oxcarbazepine. Here sodium 125 though improved to 133 on repeat essentially w/o intervention and sodium this morning is 134. Tsh wnl - will continue home meds for now though will decrease dose of nortryptyline, provided hyponatremia remains mild, as patient says has been tried on other antiepileptics and oxcarbazepine is what works best, but if sodium again worsens would involve neurology  # Fecal and urinary incontinence MRI of lumbar and thoracic spine w/o acute abnormalities, no signs cauda equina, no lower extremity neurologic symptoms. No diarrhea. Urinalysis not suggestive of infection. Now resolved. PVR wnl. -  monitor for now, outpt GI f/u if persists  # Debility Uses walker at home. PT advising snf and patient is interested - TOC pursuing SNF  # Seizure disorder Cont trileptal for now as above.  # Labial abscess Left upper labia majora - gyn has seen, advising continuing abx for now, monitor - cont bactrim - f/u gyn 1 week  # Chronic pain - home ms contin  # Chronic migraine - home verapamil, nortriptyline, meclizine, robaxin  # Insomnia - reducing dose nortryptyline  # HTN - home statin, verapamil  # GAD - home benzo  # Microcytic anemia Mild, no report of bleeding, iron studies consistent w/ iron deficiency. No report of bleeding - start oral iron - outpt GI f/u - monitor  DVT prophylaxis: SCDs (refuses pharmacologic ppx) Code Status: full Family Communication: none @ bedside  Level of care: Telemetry Medical Status is: Inpatient  Remains inpatient appropriate because: need for ongoing inpatient w/u        Consultants:  Gen surg  Procedures: none  Antimicrobials:  none    Subjective: Had one small volume episode fecal incontinence when passing gas this morning. Chronic back pain unchanged. No fever, no nausea/vomiting  Objective: Vitals:   07/11/21 1722 07/11/21 1939 07/12/21 0448 07/12/21 0842  BP: 131/72 115/69 119/65 125/73  Pulse: 78 72 65 73  Resp: 16 18 20 18   Temp: 98.7 F (37.1 C) 98.5 F (36.9 C) 98 F (36.7 C) 97.7 F (36.5 C)  TempSrc: Oral Oral Oral Oral  SpO2: 95% 94% 96% 98%  Weight:  Height:        Intake/Output Summary (Last 24 hours) at 07/12/2021 0952 Last data filed at 07/12/2021 0639 Gross per 24 hour  Intake 1080 ml  Output 1600 ml  Net -520 ml   Filed Weights   07/10/21 1045  Weight: 127 kg    Examination:  General exam: Appears calm and comfortable  Respiratory system: Clear to auscultation. Respiratory effort normal. Cardiovascular system: S1 & S2 heard, RRR. No JVD, murmurs, rubs, gallops or  clicks.  Gastrointestinal system: Abdomen is obese, soft and nontender. No organomegaly or masses felt. Normal bowel sounds heard. Central nervous system: Alert and oriented. No focal neurological deficits. Extremities: Symmetric 5 x 5 power. Trace pedal edema Skin: see skin exam from yesterday. Perineum not assessed today Psychiatry: Judgement and insight appear normal. Mood & affect appropriate.     Data Reviewed: I have personally reviewed following labs and imaging studies  CBC: Recent Labs  Lab 07/10/21 1108 07/12/21 0501  WBC 5.0 5.6  NEUTROABS 4.2  --   HGB 10.6* 10.2*  HCT 33.5* 32.4*  MCV 78.6* 79.4*  PLT 165 179   Basic Metabolic Panel: Recent Labs  Lab 07/10/21 1108 07/11/21 1043 07/12/21 0501  NA 125* 133* 134*  K 4.0 3.1* 4.8  CL 96* 98 102  CO2 22 26 24   GLUCOSE 103* 83 100*  BUN 5* 12 21  CREATININE 0.50 0.86 1.00  CALCIUM 8.5* 8.3* 8.3*  MG  --  2.3  --    GFR: Estimated Creatinine Clearance: 70.1 mL/min (by C-G formula based on SCr of 1 mg/dL). Liver Function Tests: Recent Labs  Lab 07/10/21 1108  AST 16  ALT 16  ALKPHOS 113  BILITOT 0.5  PROT 7.0  ALBUMIN 3.8   No results for input(s): LIPASE, AMYLASE in the last 168 hours. No results for input(s): AMMONIA in the last 168 hours. Coagulation Profile: No results for input(s): INR, PROTIME in the last 168 hours. Cardiac Enzymes: No results for input(s): CKTOTAL, CKMB, CKMBINDEX, TROPONINI in the last 168 hours. BNP (last 3 results) No results for input(s): PROBNP in the last 8760 hours. HbA1C: No results for input(s): HGBA1C in the last 72 hours. CBG: No results for input(s): GLUCAP in the last 168 hours. Lipid Profile: No results for input(s): CHOL, HDL, LDLCALC, TRIG, CHOLHDL, LDLDIRECT in the last 72 hours. Thyroid Function Tests: Recent Labs    07/11/21 1043  TSH 3.223   Anemia Panel: Recent Labs    07/11/21 1043  FERRITIN 13  TIBC 426  IRON 42   Urine analysis:     Component Value Date/Time   COLORURINE YELLOW 07/10/2021 1238   APPEARANCEUR CLEAR 07/10/2021 1238   LABSPEC 1.010 07/10/2021 1238   PHURINE 7.5 07/10/2021 1238   GLUCOSEU NEGATIVE 07/10/2021 1238   HGBUR SMALL (A) 07/10/2021 1238   BILIRUBINUR NEGATIVE 07/10/2021 1238   KETONESUR NEGATIVE 07/10/2021 1238   PROTEINUR NEGATIVE 07/10/2021 1238   NITRITE NEGATIVE 07/10/2021 1238   LEUKOCYTESUR NEGATIVE 07/10/2021 1238   Sepsis Labs: @LABRCNTIP (procalcitonin:4,lacticidven:4)  ) Recent Results (from the past 240 hour(s))  Resp Panel by RT-PCR (Flu A&B, Covid) Nasopharyngeal Swab     Status: None   Collection Time: 07/10/21 12:38 PM   Specimen: Nasopharyngeal Swab; Nasopharyngeal(NP) swabs in vial transport medium  Result Value Ref Range Status   SARS Coronavirus 2 by RT PCR NEGATIVE NEGATIVE Final    Comment: (NOTE) SARS-CoV-2 target nucleic acids are NOT DETECTED.  The SARS-CoV-2 RNA is generally detectable  in upper respiratory specimens during the acute phase of infection. The lowest concentration of SARS-CoV-2 viral copies this assay can detect is 138 copies/mL. A negative result does not preclude SARS-Cov-2 infection and should not be used as the sole basis for treatment or other patient management decisions. A negative result may occur with  improper specimen collection/handling, submission of specimen other than nasopharyngeal swab, presence of viral mutation(s) within the areas targeted by this assay, and inadequate number of viral copies(<138 copies/mL). A negative result must be combined with clinical observations, patient history, and epidemiological information. The expected result is Negative.  Fact Sheet for Patients:  BloggerCourse.com  Fact Sheet for Healthcare Providers:  SeriousBroker.it  This test is no t yet approved or cleared by the [place] FDA and  has been authorized for detection and/or diagnosis  of SARS-CoV-2 by FDA under an Emergency Use Authorization (EUA). This EUA will remain  in effect (meaning this test can be used) for the duration of the COVID-19 declaration under Section 564(b)(1) of the Act, 21 U.S.C.section 360bbb-3(b)(1), unless the authorization is terminated  or revoked sooner.       Influenza A by PCR NEGATIVE NEGATIVE Final   Influenza B by PCR NEGATIVE NEGATIVE Final    Comment: (NOTE) The Xpert Xpress SARS-CoV-2/FLU/RSV plus assay is intended as an aid in the diagnosis of influenza from Nasopharyngeal swab specimens and should not be used as a sole basis for treatment. Nasal washings and aspirates are unacceptable for Xpert Xpress SARS-CoV-2/FLU/RSV testing.  Fact Sheet for Patients: BloggerCourse.com  Fact Sheet for Healthcare Providers: SeriousBroker.it  This test is not yet approved or cleared by the [place] FDA and has been authorized for detection and/or diagnosis of SARS-CoV-2 by FDA under an Emergency Use Authorization (EUA). This EUA will remain in effect (meaning this test can be used) for the duration of the COVID-19 declaration under Section 564(b)(1) of the Act, 21 U.S.C. section 360bbb-3(b)(1), unless the authorization is terminated or revoked.  Performed at [facility], [address], [place], [place] [address]          Radiology Studies: MR THORACIC SPINE WO CONTRAST  Result Date: 07/10/2021 CLINICAL DATA:  Mid back pain.  Urinary and fecal incontinence. EXAM: MRI THORACIC SPINE WITHOUT CONTRAST TECHNIQUE: Multiplanar, multisequence MR imaging of the thoracic spine was performed. No intravenous contrast was administered. COMPARISON:  Thoracic spine radiographs 07/18/2015 FINDINGS: Alignment: Mild left convex curvature of the thoracic spine and mildly exaggerated thoracic kyphosis. No listhesis. Vertebrae: No fracture, suspicious marrow lesion, or significant marrow  edema. Cord:  Normal signal and morphology. Paraspinal and other soft tissues: Unremarkable. Disc levels: Shallow central disc protrusion at T3-4 without stenosis. Patent neural foramina. IMPRESSION: Shallow disc protrusion at T3-4 without stenosis. Normal appearance of the thoracic spinal cord. Electronically Signed   By: Sebastian Ache M.D.   On: 07/10/2021 18:37   MR LUMBAR SPINE WO CONTRAST  Result Date: 07/10/2021 CLINICAL DATA:  Chronic back pain prior back surgery. EXAM: MRI LUMBAR SPINE WITHOUT CONTRAST TECHNIQUE: Multiplanar, multisequence MR imaging of the lumbar spine was performed. No intravenous contrast was administered. COMPARISON:  None. FINDINGS: Segmentation:  Standard. Alignment:  Physiologic. Vertebrae:  No fracture, evidence of discitis, or bone lesion. Conus medullaris and cauda equina: Conus extends to the L1 level. Conus and cauda equina appear normal. Paraspinal and other soft tissues: Negative. Disc levels: T12-L1: No significant disc bulge. No neural foraminal stenosis. No central canal stenosis. L1-L2: Asymmetric disc protrusion with mild  narrowing of the left lateral recess. No significant neural foraminal narrowing. L2-L3: Circumferential disc protrusion with narrowing of bilateral lateral recesses. No significant neural foraminal narrowing. L3-L4: Moderate circumferential disc protrusion and ligamentum flavum hypertrophy. Moderate spinal canal narrowing. No significant neural foraminal narrowing. L4-L5: Circumferential disc protrusion and ligamentum flavum hypertrophy. Mild narrowing of spinal canal. No significant neural foraminal narrowing. L5-S1: Broad-based circumferential disc protrusion and ligamentum flavum hypertrophy. Mild spinal canal narrowing. No significant neural foraminal narrowing. IMPRESSION: 1.  No evidence of acute fracture or subluxation. 2.  Visualized cord and and cauda equina are within normal limits. 3. Multilevel degenerative disease with disc space narrowing  and marginal osteophytes. No evidence of nerve impingement. Electronically Signed   By: Larose Hires D.O.   On: 07/10/2021 18:47        Scheduled Meds:  atorvastatin  80 mg Oral QPM   levothyroxine  100 mcg Oral Q0600   methocarbamol  1,500 mg Oral QID   morphine  15 mg Oral BID   morphine  30 mg Oral QHS   nortriptyline  50 mg Oral QHS   oxcarbazepine  600 mg Oral BID   sulfamethoxazole-trimethoprim  1 tablet Oral Q12H   verapamil  120 mg Oral TID   Continuous Infusions:   LOS: 2 days    Time spent: 25 min    Silvano Bilis, MD [facility]   If 7PM-7AM, please contact night-coverage [email or web address] Password [facility] 07/12/2021, 9:52 AM

## 2021-07-12 NOTE — Progress Notes (Signed)
AVS given and reviewed with pt. Medications discussed. All questions answered to satisfaction. Pt verbalized understanding of information given. Pt escorted off the unit with all belongings via wheelchair by staff member.  

## 2021-07-12 NOTE — Plan of Care (Signed)
  Problem: Clinical Measurements: Goal: Ability to maintain clinical measurements within normal limits will improve Outcome: Progressing Goal: Will remain free from infection Outcome: Progressing Goal: Diagnostic test results will improve Outcome: Progressing   Problem: Coping: Goal: Level of anxiety will decrease Outcome: Progressing   Problem: Pain Managment: Goal: General experience of comfort will improve Outcome: Progressing   Problem: Safety: Goal: Ability to remain free from injury will improve Outcome: Progressing

## 2021-07-12 NOTE — TOC Initial Note (Signed)
Transition of Care [facility]) - Initial/Assessment Note    Patient Details  Name: Gabriela Miller MRN: [id number] Date of Birth: [birth year]  [facility] CM/SW Contact:    Luvenia Redden, RN Phone Number: 07/12/2021, 12:58 PM  Clinical Narrative:                 Spoke with the pt on [facility]. Pt receptive to [facility] and has a history with this agency in the past. Referral made to [facility] for PT/OT/RN. Pt receptive and reported all the DME in the home (walker, rollator and 3-1 commode). Pt has a good support sytem with her spouse, transportation to all her medical appointments and can afford her medications. All inquires addressed with no additional request or needs at this time.  TOC will continue to follow for any additional needs.    Barriers to Discharge: Barriers Resolved   Patient Goals and CMS Choice   CMS Medicare.gov Compare Post Acute Care list provided to:: Patient Choice offered to / list presented to : Patient  Expected Discharge Plan and Services           Expected Discharge Date: 07/12/21                         HH Arranged: RN, PT, OT HH Agency: [facility] Date HH Agency Contacted: 07/12/21 Time HH Agency Contacted: 1143 Representative spoke with at [facility] Agency: Barbara Cower  Prior Living Arrangements/Services                       Activities of Daily Living Home Assistive Devices/Equipment: Eyeglasses, Environmental consultant (specify type) Nature conservation officer) ADL Screening (condition at time of admission) Patient's cognitive ability adequate to safely complete daily activities?: Yes Is the patient deaf or have difficulty hearing?: No Does the patient have difficulty seeing, even when wearing glasses/contacts?: Yes Does the patient have difficulty concentrating, remembering, or making decisions?: No Patient able to express need for assistance with ADLs?: Yes Does the patient have difficulty dressing or bathing?: Yes Independently  performs ADLs?: No Communication: Independent Dressing (OT): Needs assistance Is this a change from baseline?: Change from baseline, expected to last <3days Grooming: Independent Feeding: Independent Bathing: Needs assistance Is this a change from baseline?: Change from baseline, expected to last <3 days Toileting: Needs assistance Is this a change from baseline?: Change from baseline, expected to last <3 days In/Out Bed: Needs assistance Is this a change from baseline?: Change from baseline, expected to last <3 days Walks in Home: Independent with device (comment) (rollater) Does the patient have difficulty walking or climbing stairs?: Yes Weakness of Legs: Both Weakness of Arms/Hands: Both  Permission Sought/Granted                  Emotional Assessment              Admission diagnosis:  Hyponatremia [E87.1] Weakness [R53.1] Hypertension, unspecified type [I10] Patient Active Problem List   Diagnosis Date Noted   Hyponatremia 07/10/2021   Weakness 07/10/2021   Seizures (HCC)    CHF (congestive heart failure) (HCC)    Morbid obesity with BMI of 45.0-49.9, adult (HCC)    Hypothyroidism    C7 cervical fracture (HCC) 05/13/2019   COPD (chronic obstructive pulmonary disease) (HCC) 12/21/2018   Hypertension 11/05/2012   Chronic pain syndrome 12/05/2010   PCP:  Lance Bosch, MD Pharmacy:   [facility] [address] [place] - [phone]  [facility] [address] [place] [facility] [place] [address] Phone: [phone] Fax: [phone]     Social Determinants of Health (SDOH) Interventions    Readmission Risk Interventions No flowsheet data found.

## 2023-07-01 ENCOUNTER — Encounter: Payer: Self-pay | Admitting: Emergency Medicine

## 2023-07-01 ENCOUNTER — Other Ambulatory Visit: Payer: Self-pay

## 2023-07-01 ENCOUNTER — Emergency Department: Payer: Medicare Other

## 2023-07-01 ENCOUNTER — Emergency Department
Admission: EM | Admit: 2023-07-01 | Discharge: 2023-07-01 | Disposition: A | Discharge status: Home / Self Care | Payer: Medicare Other | Attending: Emergency Medicine | Admitting: Emergency Medicine

## 2023-07-01 DIAGNOSIS — S0990XA Unspecified injury of head, initial encounter: Secondary | ICD-10-CM | POA: Insufficient documentation

## 2023-07-01 DIAGNOSIS — S8011XA Contusion of right lower leg, initial encounter: Secondary | ICD-10-CM | POA: Diagnosis not present

## 2023-07-01 DIAGNOSIS — J449 Chronic obstructive pulmonary disease, unspecified: Secondary | ICD-10-CM | POA: Diagnosis not present

## 2023-07-01 DIAGNOSIS — E039 Hypothyroidism, unspecified: Secondary | ICD-10-CM | POA: Diagnosis not present

## 2023-07-01 DIAGNOSIS — S8991XA Unspecified injury of right lower leg, initial encounter: Secondary | ICD-10-CM | POA: Diagnosis present

## 2023-07-01 DIAGNOSIS — S8001XA Contusion of right knee, initial encounter: Secondary | ICD-10-CM | POA: Insufficient documentation

## 2023-07-01 DIAGNOSIS — I110 Hypertensive heart disease with heart failure: Secondary | ICD-10-CM | POA: Diagnosis not present

## 2023-07-01 DIAGNOSIS — W01198A Fall on same level from slipping, tripping and stumbling with subsequent striking against other object, initial encounter: Secondary | ICD-10-CM | POA: Diagnosis not present

## 2023-07-01 DIAGNOSIS — I509 Heart failure, unspecified: Secondary | ICD-10-CM | POA: Insufficient documentation

## 2023-07-01 DIAGNOSIS — W19XXXA Unspecified fall, initial encounter: Secondary | ICD-10-CM

## 2023-07-01 NOTE — ED Notes (Addendum)
See triage note  Presents s/p fall. States she was sitting on edge of her bed  Dropped a pill  bend down to pick up her pill  States she landed on her knees and then hit her head    Large bruising noted to right lower leg

## 2023-07-01 NOTE — ED Triage Notes (Signed)
Pt here via [facility] after a fall. Pt fell this morning and landed on her knees, hematoma to right leg. Pt states she also hit her head but did not lose consciousness.  70 98% RA 142/72

## 2023-07-01 NOTE — Discharge Instructions (Signed)
Please keep your leg elevated and continue to apply cool compresses to the area of swelling.  Your x-rays and CT scans were normal.  Please follow-up with your outpatient provider.  It was a pleasure caring for you today.

## 2023-07-01 NOTE — ED Provider Notes (Signed)
[facility] Provider Note    Event Date/Time   First MD Initiated Contact with Patient 07/01/23 1109     (approximate)   History   Fall   HPI  Gabriela Miller is a 72 y.o. female with a past medical history of CHF, obesity, COPD, hypertension who presents today for evaluation after a fall.  Patient reports that she went to lean against something that was not there and she fell, striking her right lower leg on something as she fell.  She believes that she hit her head but she is unsure on what.  She denies LOC.  She denies headache or neck pain currently.  She reports that she has pain where she hit her lower leg but no pain elsewhere.  She reports that she is able to ambulate.  She denies taking anticoagulation.  Patient Active Problem List   Diagnosis Date Noted   Hyponatremia 07/10/2021   Weakness 07/10/2021   Seizures (HCC)    CHF (congestive heart failure) (HCC)    Morbid obesity with BMI of 45.0-49.9, adult (HCC)    Hypothyroidism    C7 cervical fracture (HCC) 05/13/2019   COPD (chronic obstructive pulmonary disease) (HCC) 12/21/2018   Hypertension 11/05/2012   Chronic pain syndrome 12/05/2010          Physical Exam   Triage Vital Signs: ED Triage Vitals  Encounter Vitals Group     BP 07/01/23 0953 (!) 116/90     Systolic BP Percentile --      Diastolic BP Percentile --      Pulse Rate 07/01/23 0953 68     Resp 07/01/23 0953 18     Temp 07/01/23 0953 98.1 F (36.7 C)     Temp Source 07/01/23 0953 Oral     SpO2 07/01/23 0953 96 %     Weight 07/01/23 0953 260 lb (117.9 kg)     Height 07/01/23 0953 5\' 4"  (1.626 m)     Head Circumference --      Peak Flow --      Pain Score 07/01/23 0955 10     Pain Loc --      Pain Education --      Exclude from Growth Chart --     Most recent vital signs: Vitals:   07/01/23 0953  BP: (!) 116/90  Pulse: 68  Resp: 18  Temp: 98.1 F (36.7 C)  SpO2: 96%    Physical Exam Vitals and nursing  note reviewed.  Constitutional:      General: Awake and alert. No acute distress.    Appearance: Normal appearance. The patient is obese.  HENT:     Head: Normocephalic and atraumatic.     Mouth: Mucous membranes are moist.  Eyes:     General: PERRL. Normal EOMs        Right eye: No discharge.        Left eye: No discharge.     Conjunctiva/sclera: Conjunctivae normal.  Cardiovascular:     Rate and Rhythm: Normal rate and regular rhythm.     Pulses: Normal pulses.  Pulmonary:     Effort: Pulmonary effort is normal. No respiratory distress.     Breath sounds: Normal breath sounds.  Abdominal:     Abdomen is soft. There is no abdominal tenderness. No rebound or guarding. No distention. Musculoskeletal:        General: No swelling. Normal range of motion.     Cervical back: Normal range of motion  and neck supple. No midline cervical spine tenderness.  Full range of motion of neck.  Negative Spurling test.  Negative Lhermitte sign.  Normal strength and sensation in bilateral upper extremities. Normal grip strength bilaterally.  Normal intrinsic muscle function of the hand bilaterally.  Normal radial pulses bilaterally. Skin:    General: Skin is warm and dry.     Capillary Refill: Capillary refill takes less than 2 seconds.     Findings: Right lower extremity hematoma to the proximal tib-fib, below the knee.  Patient has full and normal range of motion of bilateral hips, knees, ankles.  Normal distal pulses bilaterally.  Area of ecchymosis to her extremities approximately 5 x 5 cm.  No evidence of active extravasation. Neurological:     Mental Status: The patient is awake and alert.   Neurological: GCS 15 alert and oriented x3 Normal speech, no expressive or receptive aphasia or dysarthria Cranial nerves II through XII intact Normal visual fields 5 out of 5 strength in all 4 extremities with intact sensation throughout No extremity drift Normal finger-to-nose testing, no limb or truncal  ataxia    ED Results / Procedures / Treatments   Labs (all labs ordered are listed, but only abnormal results are displayed) Labs Reviewed - No data to display   EKG     RADIOLOGY I independently reviewed and interpreted imaging and agree with radiologists findings.     PROCEDURES:  Critical Care performed:   Procedures   MEDICATIONS ORDERED IN ED: Medications - No data to display   IMPRESSION / MDM / ASSESSMENT AND PLAN / ED COURSE  I reviewed the triage vital signs and the nursing notes.   Differential diagnosis includes, but is not limited to, contusion, concussion, hematoma, fracture.  Patient is awake and alert, hemodynamically stable and afebrile.  She is nontoxic in appearance.  She has no focal neurological deficits.  She has normal range of motion of all 4 extremities.  She is not anticoagulated.  CT head and neck obtained per [place] criteria are negative for any acute findings.  X-ray of her knee obtained in triage is also negative for any acute findings.  She has an obvious area of ecchymosis/hematoma to her proximal medial anterior tib-fib, minimally tender to palpation, there are no open wounds or active extravasation.  She is not anticoagulated.  Her leg is soft and compressible throughout, normal pedal pulses, normal sensation, no pain out of proportion, do not suspect compartment syndrome.  She is ambulatory with a steady gait.  Patient is reassured by her negative findings.  We discussed tricked return precautions and the importance of close outpatient follow-up.  Also recommended that she keep her leg elevated and apply cool compresses to that area.  Patient is in agreement with this plan.  She was discharged in stable condition.   Patient's presentation is most consistent with acute complicated illness / injury requiring diagnostic workup.    FINAL CLINICAL IMPRESSION(S) / ED DIAGNOSES   Final diagnoses:  Fall, initial encounter  Contusion of  right knee and lower leg, initial encounter     Rx / DC Orders   ED Discharge Orders     None        Note:  This document was prepared using Dragon voice recognition software and may include unintentional dictation errors.   Jackelyn Hoehn, PA-C 07/01/23 1745    Pilar Jarvis, MD 07/01/23 [phone]
